# Patient Record
Sex: Male | Born: 1955 | Race: White | Hispanic: No | Marital: Married | Smoking: Never smoker
Health system: Southern US, Community
[De-identification: ages and names within clinical notes are randomized; demographics above are authoritative.]

## PROBLEM LIST (undated history)

## (undated) DIAGNOSIS — R001 Bradycardia, unspecified: Secondary | ICD-10-CM

## (undated) DIAGNOSIS — R0600 Dyspnea, unspecified: Secondary | ICD-10-CM

## (undated) DIAGNOSIS — E785 Hyperlipidemia, unspecified: Secondary | ICD-10-CM

## (undated) DIAGNOSIS — M109 Gout, unspecified: Secondary | ICD-10-CM

## (undated) DIAGNOSIS — Z8719 Personal history of other diseases of the digestive system: Secondary | ICD-10-CM

## (undated) DIAGNOSIS — R011 Cardiac murmur, unspecified: Secondary | ICD-10-CM

## (undated) DIAGNOSIS — I10 Essential (primary) hypertension: Secondary | ICD-10-CM

## (undated) DIAGNOSIS — G510 Bell's palsy: Secondary | ICD-10-CM

## (undated) HISTORY — PX: TONSILLECTOMY: SUR1361

## (undated) HISTORY — DX: Bell's palsy: G51.0

## (undated) HISTORY — DX: Essential (primary) hypertension: I10

## (undated) HISTORY — DX: Gout, unspecified: M10.9

## (undated) HISTORY — PX: COLONOSCOPY: SHX174

## (undated) HISTORY — DX: Hyperlipidemia, unspecified: E78.5

---

## 2002-04-27 ENCOUNTER — Encounter: Payer: Self-pay | Admitting: Cardiology

## 2002-04-27 ENCOUNTER — Ambulatory Visit (HOSPITAL_COMMUNITY): Admission: RE | Admit: 2002-04-27 | Discharge: 2002-04-27 | Payer: Self-pay | Admitting: Cardiology

## 2006-04-04 ENCOUNTER — Encounter (INDEPENDENT_AMBULATORY_CARE_PROVIDER_SITE_OTHER): Payer: Self-pay | Admitting: *Deleted

## 2007-09-04 ENCOUNTER — Emergency Department (HOSPITAL_COMMUNITY): Admission: EM | Admit: 2007-09-04 | Discharge: 2007-09-05 | Payer: Self-pay | Admitting: Emergency Medicine

## 2012-09-14 ENCOUNTER — Ambulatory Visit (INDEPENDENT_AMBULATORY_CARE_PROVIDER_SITE_OTHER): Payer: PRIVATE HEALTH INSURANCE | Admitting: Family Medicine

## 2012-09-14 ENCOUNTER — Encounter: Payer: Self-pay | Admitting: Family Medicine

## 2012-09-14 VITALS — BP 140/76 | HR 53 | Temp 97.9°F | Resp 16 | Ht 68.0 in | Wt 198.0 lb

## 2012-09-14 DIAGNOSIS — Z0000 Encounter for general adult medical examination without abnormal findings: Secondary | ICD-10-CM

## 2012-09-14 DIAGNOSIS — G510 Bell's palsy: Secondary | ICD-10-CM

## 2012-09-14 LAB — CBC WITH DIFFERENTIAL/PLATELET
Basophils Absolute: 0.1 10*3/uL (ref 0.0–0.1)
Basophils Relative: 1 % (ref 0–1)
Eosinophils Absolute: 0.2 10*3/uL (ref 0.0–0.7)
Eosinophils Relative: 2 % (ref 0–5)
HCT: 44.9 % (ref 39.0–52.0)
Hemoglobin: 15.4 g/dL (ref 13.0–17.0)
Lymphocytes Relative: 28 % (ref 12–46)
Lymphs Abs: 2.2 10*3/uL (ref 0.7–4.0)
MCH: 30.1 pg (ref 26.0–34.0)
MCHC: 34.3 g/dL (ref 30.0–36.0)
MCV: 87.9 fL (ref 78.0–100.0)
Monocytes Absolute: 0.7 10*3/uL (ref 0.1–1.0)
Monocytes Relative: 9 % (ref 3–12)
Neutro Abs: 4.8 10*3/uL (ref 1.7–7.7)
Neutrophils Relative %: 60 % (ref 43–77)
Platelets: 221 10*3/uL (ref 150–400)
RBC: 5.11 MIL/uL (ref 4.22–5.81)
RDW: 13.8 % (ref 11.5–15.5)
WBC: 7.9 10*3/uL (ref 4.0–10.5)

## 2012-09-14 LAB — COMPREHENSIVE METABOLIC PANEL
ALT: 18 U/L (ref 0–53)
AST: 21 U/L (ref 0–37)
Albumin: 4.8 g/dL (ref 3.5–5.2)
Alkaline Phosphatase: 58 U/L (ref 39–117)
BUN: 22 mg/dL (ref 6–23)
CO2: 27 mEq/L (ref 19–32)
Calcium: 9.7 mg/dL (ref 8.4–10.5)
Chloride: 103 mEq/L (ref 96–112)
Creat: 1.12 mg/dL (ref 0.50–1.35)
Glucose, Bld: 78 mg/dL (ref 70–99)
Potassium: 4 mEq/L (ref 3.5–5.3)
Sodium: 139 mEq/L (ref 135–145)
Total Bilirubin: 1.2 mg/dL (ref 0.3–1.2)
Total Protein: 6.5 g/dL (ref 6.0–8.3)

## 2012-09-14 LAB — LIPID PANEL
Cholesterol: 195 mg/dL (ref 0–200)
HDL: 56 mg/dL (ref >39)
LDL Cholesterol: 101 mg/dL — ABNORMAL HIGH (ref 0–99)
Total CHOL/HDL Ratio: 3.5 Ratio
Triglycerides: 189 mg/dL — ABNORMAL HIGH (ref <150)
VLDL: 38 mg/dL (ref 0–40)

## 2012-09-14 LAB — POCT UA - MICROSCOPIC ONLY
Bacteria, U Microscopic: NEGATIVE
Casts, Ur, LPF, POC: NEGATIVE
Crystals, Ur, HPF, POC: NEGATIVE
Mucus, UA: NEGATIVE
WBC, Ur, HPF, POC: NEGATIVE
Yeast, UA: NEGATIVE

## 2012-09-14 LAB — POCT GLYCOSYLATED HEMOGLOBIN (HGB A1C): Hemoglobin A1C: 4.8

## 2012-09-14 LAB — POCT URINALYSIS DIPSTICK
Bilirubin, UA: NEGATIVE
Glucose, UA: NEGATIVE
Ketones, UA: NEGATIVE
Leukocytes, UA: NEGATIVE
Nitrite, UA: NEGATIVE
Protein, UA: NEGATIVE
Spec Grav, UA: 1.025
Urobilinogen, UA: 0.2
pH, UA: 5

## 2012-09-14 LAB — IFOBT (OCCULT BLOOD): IFOBT: NEGATIVE

## 2012-09-14 NOTE — Progress Notes (Signed)
Patient ID: Dean Weaver MRN: [id number], DOB: August 11, 1955 57 y.o. Date of Encounter: 09/14/2012, 1:30 PM  Primary Physician: No primary provider on file.  Chief Complaint: Physical (CPE)  HPI: 57 y.o. y/o male with history noted below here for CPE.  Doing well. No issues/complaints.  Had Bell's palsy 5 years ago with complete neurological workup negative otherwise. Married, 2 grown boys Now exercising regularly on pilates and spin class.  He has lost 10 pounds over the summer. Colonoscopy normal 2010 Last cholesterol 2008 (total cholesterol 220)  Review of Systems: Consitutional: No fever, chills, fatigue, night sweats, lymphadenopathy, or weight changes. Eyes: No visual changes, eye redness, or discharge. ENT/Mouth: Ears: No otalgia, tinnitus, hearing loss, discharge. Nose: No congestion, rhinorrhea, sinus pain, or epistaxis. Throat: No sore throat, post nasal drip, or teeth pain. Cardiovascular: No CP, palpitations, diaphoresis, DOE, edema, orthopnea, PND. Respiratory: No cough, hemoptysis, SOB, or wheezing. Gastrointestinal: No anorexia, dysphagia, reflux, pain, nausea, vomiting, hematemesis, diarrhea, constipation, BRBPR, or melena. Genitourinary: No dysuria, frequency, urgency, hematuria, incontinence, nocturia, decreased urinary stream, discharge, impotence, or testicular pain/masses. Musculoskeletal: No decreased ROM, myalgias, stiffness, joint swelling, or weakness. Skin: No rash, erythema, lesion changes, pain, warmth, jaundice, or pruritis. Neurological: No headache, dizziness, syncope, seizures, tremors, memory loss, coordination problems, or paresthesias.  H/O Bell's palsy Psychological: No anxiety, depression, hallucinations, SI/HI. Endocrine: No fatigue, polydipsia, polyphagia, polyuria, or known diabetes. All other systems were reviewed and are otherwise negative.  No past medical history on file.   No past surgical history on file.  Home Meds:  Prior to  Admission medications   Not on File    Allergies: Not on File  History   Social History  . Marital Status: Married    Spouse Name: N/A    Number of Children: N/A  . Years of Education: N/A   Occupational History  . Not on file.   Social History Main Topics  . Smoking status: Never Smoker   . Smokeless tobacco: Not on file  . Alcohol Use: Not on file  . Drug Use: Not on file  . Sexual Activity: Not on file   Other Topics Concern  . Not on file   Social History Narrative  . No narrative on file    No family history on file.  Physical Exam:  Blood pressure recheck 118/78 Blood pressure 140/76, pulse 53, temperature 97.9 F (36.6 C), temperature source Oral, resp. rate 16, height 5\' 8"  (1.727 m), weight 198 lb (89.812 kg), SpO2 98.00%.  General: Well developed, well nourished, in no acute distress. HEENT: Normocephalic, atraumatic. Conjunctiva pink, sclera non-icteric. Pupils 2 mm constricting to 1 mm, round, regular, and equally reactive to light and accomodation. EOMI. Internal auditory canal clear. TMs with good cone of light and without pathology. Nasal mucosa pink. Nares are without discharge. No sinus tenderness. Oral mucosa pink. Dentition good. Pharynx without exudate.   Neck: Supple. Trachea midline. No thyromegaly. Full ROM. No lymphadenopathy. Lungs: Clear to auscultation bilaterally without wheezes, rales, or rhonchi. Breathing is of normal effort and unlabored. Cardiovascular: RRR with S1 S2. No murmurs, rubs, or gallops appreciated. Distal pulses 2+ symmetrically. No carotid or abdominal bruits Abdomen: Soft, non-tender, non-distended with normoactive bowel sounds. No hepatosplenomegaly or masses. No rebound/guarding. No CVA tenderness. Without hernias.  Rectal: No external hemorrhoids or fissures. Rectal vault without masses.  Genitourinary:  circumcised male. No penile lesions. Testes descended bilaterally, and smooth without tenderness or masses.    Musculoskeletal: Full range of motion and  5/5 strength throughout. Without swelling, atrophy, tenderness, crepitus, or warmth. Extremities without clubbing, cyanosis, or edema. Calves supple. Skin: Warm and moist without erythema, ecchymosis, wounds, or rash. Neuro: A+Ox3. CN II-XII grossly intact with exception of mild left facial weakness. Moves all extremities spontaneously. Full sensation throughout. Normal gait. DTR 2+ throughout upper and lower extremities. Psych:  Responds to questions appropriately with a normal affect.   Studies: CBC, CMET, Lipid, PSA, UA: pending  Assessment/Plan:  57 y.o. y/o  male here for CPE Routine general medical examination at a health care facility - Plan: Lipid panel, CBC with Differential, Comprehensive metabolic panel, PSA, POCT urinalysis dipstick, POCT UA - Microscopic Only, POCT glycosylated hemoglobin (Hb A1C), IFOBT POC (occult bld, rslt in office)    Signed, Elvina Sidle, MD 09/14/2012 1:30 PM

## 2012-09-14 NOTE — Patient Instructions (Addendum)
Patient ID: LAJARVIS ITALIANO MRN: [id number], DOB: [birth year] 57 y.o. Date of Encounter: 09/14/2012, 1:30 PM  Primary Physician: No primary provider on file.  Chief Complaint: Physical (CPE)  HPI: 57 y.o. y/o male with history noted below here for CPE.  Doing well. No issues/complaints.  Had Bell's palsy 5 years ago with complete neurological workup negative otherwise. Married, 2 boys Exercising on pilates and spin class regularly   Review of Systems: Consitutional: No fever, chills, fatigue, night sweats, lymphadenopathy, or weight changes. Eyes: No visual changes, eye redness, or discharge. ENT/Mouth: Ears: No otalgia, tinnitus, hearing loss, discharge. Nose: No congestion, rhinorrhea, sinus pain, or epistaxis. Throat: No sore throat, post nasal drip, or teeth pain. Cardiovascular: No CP, palpitations, diaphoresis, DOE, edema, orthopnea, PND. Respiratory: No cough, hemoptysis, SOB, or wheezing. Gastrointestinal: No anorexia, dysphagia, reflux, pain, nausea, vomiting, hematemesis, diarrhea, constipation, BRBPR, or melena. Genitourinary: No dysuria, frequency, urgency, hematuria, incontinence, nocturia, decreased urinary stream, discharge, impotence, or testicular pain/masses. Musculoskeletal: No decreased ROM, myalgias, stiffness, joint swelling, or weakness. Skin: No rash, erythema, lesion changes, pain, warmth, jaundice, or pruritis. Neurological: No headache, dizziness, syncope, seizures, tremors, memory loss, coordination problems, or paresthesias.  H/O Bell's palsy Psychological: No anxiety, depression, hallucinations, SI/HI. Endocrine: No fatigue, polydipsia, polyphagia, polyuria, or known diabetes. All other systems were reviewed and are otherwise negative.  No past medical history on file.   No past surgical history on file.  Home Meds:  Prior to Admission medications   Not on File    Allergies: Not on File  History   Social History  . Marital Status: Married   Spouse Name: N/A    Number of Children: N/A  . Years of Education: N/A   Occupational History  . Not on file.   Social History Main Topics  . Smoking status: Never Smoker   . Smokeless tobacco: Not on file  . Alcohol Use: Not on file  . Drug Use: Not on file  . Sexual Activity: Not on file   Other Topics Concern  . Not on file   Social History Narrative  . No narrative on file    No family history on file.  Physical Exam:  Blood pressure recheck 118/78 Blood pressure 140/76, pulse 53, temperature 97.9 F (36.6 C), temperature source Oral, resp. rate 16, height 5\' 8"  (1.727 m), weight 198 lb (89.812 kg), SpO2 98.00%.  General: Well developed, well nourished, in no acute distress. HEENT: Normocephalic, atraumatic. Conjunctiva pink, sclera non-icteric. Pupils 2 mm constricting to 1 mm, round, regular, and equally reactive to light and accomodation. EOMI. Internal auditory canal clear. TMs with good cone of light and without pathology. Nasal mucosa pink. Nares are without discharge. No sinus tenderness. Oral mucosa pink. Dentition good. Pharynx without exudate.   Neck: Supple. Trachea midline. No thyromegaly. Full ROM. No lymphadenopathy. Lungs: Clear to auscultation bilaterally without wheezes, rales, or rhonchi. Breathing is of normal effort and unlabored. Cardiovascular: RRR with S1 S2. No murmurs, rubs, or gallops appreciated. Distal pulses 2+ symmetrically. No carotid or abdominal bruits Abdomen: Soft, non-tender, non-distended with normoactive bowel sounds. No hepatosplenomegaly or masses. No rebound/guarding. No CVA tenderness. Without hernias.  Rectal: No external hemorrhoids or fissures. Rectal vault without masses.  Genitourinary:  circumcised male. No penile lesions. Testes descended bilaterally, and smooth without tenderness or masses.  Musculoskeletal: Full range of motion and 5/5 strength throughout. Without swelling, atrophy, tenderness, crepitus, or warmth. Extremities  without clubbing, cyanosis, or edema. Calves supple. Skin: Warm and moist  without erythema, ecchymosis, wounds, or rash. Neuro: A+Ox3. CN II-XII grossly intact. Moves all extremities spontaneously. Full sensation throughout. Normal gait. DTR 2+ throughout upper and lower extremities. Finger to nose intact. Psych:  Responds to questions appropriately with a normal affect.   Studies: CBC, CMET, Lipid, PSA, UA: pending  Assessment/Plan:  57 y.o. y/o  male here for CPE.  He has lost 10 pounds and is now much more fit than he has been in years.  Expect labs will be normal.  Signed, Elvina Sidle, MD 09/14/2012 1:30 PM

## 2012-09-15 LAB — PSA: PSA: 1.14 ng/mL (ref <=4.00)

## 2012-10-21 DIAGNOSIS — Z0271 Encounter for disability determination: Secondary | ICD-10-CM

## 2013-05-14 ENCOUNTER — Other Ambulatory Visit: Payer: Self-pay | Admitting: Family Medicine

## 2013-05-14 DIAGNOSIS — M109 Gout, unspecified: Secondary | ICD-10-CM

## 2013-05-14 MED ORDER — COLCHICINE 0.6 MG PO TABS: 0.6000 mg | ORAL_TABLET | Freq: Every day | ORAL | Status: DC

## 2013-10-11 ENCOUNTER — Encounter: Payer: PRIVATE HEALTH INSURANCE | Admitting: Family Medicine

## 2013-11-01 ENCOUNTER — Ambulatory Visit (INDEPENDENT_AMBULATORY_CARE_PROVIDER_SITE_OTHER): Payer: PRIVATE HEALTH INSURANCE | Admitting: Family Medicine

## 2013-11-01 ENCOUNTER — Encounter: Payer: Self-pay | Admitting: Family Medicine

## 2013-11-01 VITALS — BP 138/90 | HR 47 | Temp 97.7°F | Resp 16 | Ht 68.5 in | Wt 196.2 lb

## 2013-11-01 DIAGNOSIS — G510 Bell's palsy: Secondary | ICD-10-CM

## 2013-11-01 DIAGNOSIS — R030 Elevated blood-pressure reading, without diagnosis of hypertension: Secondary | ICD-10-CM

## 2013-11-01 DIAGNOSIS — Z0000 Encounter for general adult medical examination without abnormal findings: Secondary | ICD-10-CM

## 2013-11-01 DIAGNOSIS — M1000 Idiopathic gout, unspecified site: Secondary | ICD-10-CM

## 2013-11-01 DIAGNOSIS — Z1211 Encounter for screening for malignant neoplasm of colon: Secondary | ICD-10-CM

## 2013-11-01 DIAGNOSIS — M109 Gout, unspecified: Secondary | ICD-10-CM

## 2013-11-01 DIAGNOSIS — IMO0001 Reserved for inherently not codable concepts without codable children: Secondary | ICD-10-CM

## 2013-11-01 LAB — COMPLETE METABOLIC PANEL WITH GFR
ALT: 18 U/L (ref 0–53)
AST: 18 U/L (ref 0–37)
Albumin: 4.7 g/dL (ref 3.5–5.2)
Alkaline Phosphatase: 62 U/L (ref 39–117)
BUN: 23 mg/dL (ref 6–23)
CO2: 28 mEq/L (ref 19–32)
Calcium: 9.6 mg/dL (ref 8.4–10.5)
Chloride: 105 mEq/L (ref 96–112)
Creat: 1.31 mg/dL (ref 0.50–1.35)
GFR, Est African American: 68 mL/min
GFR, Est Non African American: 59 mL/min — ABNORMAL LOW
Glucose, Bld: 86 mg/dL (ref 70–99)
Potassium: 4.4 mEq/L (ref 3.5–5.3)
Sodium: 140 mEq/L (ref 135–145)
Total Bilirubin: 0.8 mg/dL (ref 0.2–1.2)
Total Protein: 6.8 g/dL (ref 6.0–8.3)

## 2013-11-01 LAB — CBC WITH DIFFERENTIAL/PLATELET
Basophils Absolute: 0.1 10*3/uL (ref 0.0–0.1)
Basophils Relative: 1 % (ref 0–1)
Eosinophils Absolute: 0.1 10*3/uL (ref 0.0–0.7)
Eosinophils Relative: 2 % (ref 0–5)
HCT: 45.1 % (ref 39.0–52.0)
Hemoglobin: 15.1 g/dL (ref 13.0–17.0)
Lymphocytes Relative: 23 % (ref 12–46)
Lymphs Abs: 1.6 10*3/uL (ref 0.7–4.0)
MCH: 29.5 pg (ref 26.0–34.0)
MCHC: 33.5 g/dL (ref 30.0–36.0)
MCV: 88.1 fL (ref 78.0–100.0)
Monocytes Absolute: 0.6 10*3/uL (ref 0.1–1.0)
Monocytes Relative: 8 % (ref 3–12)
Neutro Abs: 4.6 10*3/uL (ref 1.7–7.7)
Neutrophils Relative %: 66 % (ref 43–77)
Platelets: 246 10*3/uL (ref 150–400)
RBC: 5.12 MIL/uL (ref 4.22–5.81)
RDW: 13.5 % (ref 11.5–15.5)
WBC: 6.9 10*3/uL (ref 4.0–10.5)

## 2013-11-01 LAB — POCT URINALYSIS DIPSTICK
Bilirubin, UA: NEGATIVE
Glucose, UA: NEGATIVE
Ketones, UA: NEGATIVE
Leukocytes, UA: NEGATIVE
Nitrite, UA: NEGATIVE
Protein, UA: NEGATIVE
Spec Grav, UA: 1.01
Urobilinogen, UA: 0.2
pH, UA: 5

## 2013-11-01 LAB — URIC ACID: Uric Acid, Serum: 7.8 mg/dL (ref 4.0–7.8)

## 2013-11-01 LAB — LIPID PANEL
Cholesterol: 199 mg/dL (ref 0–200)
HDL: 54 mg/dL (ref >39)
LDL Cholesterol: 115 mg/dL — ABNORMAL HIGH (ref 0–99)
Total CHOL/HDL Ratio: 3.7 Ratio
Triglycerides: 150 mg/dL — ABNORMAL HIGH (ref <150)
VLDL: 30 mg/dL (ref 0–40)

## 2013-11-01 LAB — IFOBT (OCCULT BLOOD): IFOBT: NEGATIVE

## 2013-11-01 LAB — SEDIMENTATION RATE: Sed Rate: 4 mm/hr (ref 0–16)

## 2013-11-01 MED ORDER — FLUOXETINE HCL 20 MG PO TABS: 20.0000 mg | ORAL_TABLET | Freq: Every day | ORAL | Status: DC

## 2013-11-01 MED ORDER — LISINOPRIL 10 MG PO TABS: 10.0000 mg | ORAL_TABLET | Freq: Every day | ORAL | Status: DC

## 2013-11-01 NOTE — Patient Instructions (Signed)

## 2013-11-01 NOTE — Progress Notes (Signed)
Subjective:    Patient ID: Dean Weaver, male    DOB: 04-Sep-1955, 58 y.o.   MRN: [id number]  HPI    Review of Systems  Constitutional: Negative.   HENT: Negative.   Eyes: Positive for discharge.  Respiratory: Negative.   Cardiovascular: Negative.   Gastrointestinal: Negative.   Endocrine: Negative.   Genitourinary: Negative.   Musculoskeletal: Positive for arthralgias, myalgias and neck stiffness.  Skin: Negative.   Allergic/Immunologic: Negative.   Neurological: Negative.   Hematological: Negative.   Psychiatric/Behavioral: Negative.        Objective:   Physical Exam        Assessment & Plan:     Patient ID: Dean Weaver MRN: [id number], DOB: 04-Sep-1955 58 y.o. Date of Encounter: 11/01/2013, 10:54 AM  Primary Physician: No primary provider on file.  Chief Complaint: Physical (CPE)  HPI: 58 y.o. y/o male with history noted below here for CPE.  Doing well. No issues/complaints.  Review of Systems: Consitutional: No fever, chills, fatigue, night sweats, lymphadenopathy, or weight changes. Eyes: No visual changes, eye redness, or discharge. ENT/Mouth: Ears: No otalgia, tinnitus, hearing loss, discharge. Nose: No congestion, rhinorrhea, sinus pain, or epistaxis. Throat: No sore throat, post nasal drip, or teeth pain. Cardiovascular: No CP, palpitations, diaphoresis, DOE, edema, orthopnea, PND. Respiratory: No cough, hemoptysis, SOB, or wheezing. Gastrointestinal: No anorexia, dysphagia, reflux, pain, nausea, vomiting, hematemesis, diarrhea, constipation, BRBPR, or melena. Genitourinary: No dysuria, frequency, urgency, hematuria, incontinence, nocturia, decreased urinary stream, discharge, impotence, or testicular pain/masses. Musculoskeletal: No decreased ROM, myalgias, stiffness, joint swelling, or weakness. Skin: No rash, erythema, lesion changes, pain, warmth, jaundice, or pruritis. Neurological: No headache, dizziness, syncope, seizures, tremors,  memory loss, coordination problems, or paresthesias. Psychological: No anxiety, depression, hallucinations, SI/HI. Endocrine: No fatigue, polydipsia, polyphagia, polyuria, or known diabetes. All other systems were reviewed and are otherwise negative.  Past Medical History  Diagnosis Date  . Bell's palsy      No past surgical history on file.  Home Meds:  Prior to Admission medications   Medication Sig Start Date End Date Taking? Authorizing Provider  colchicine 0.6 MG tablet Take 1 tablet (0.6 mg total) by mouth daily. 05/14/13   Elvina SidleKurt Lauenstein, MD  FLUoxetine (PROZAC) 20 MG tablet Take 1 tablet (20 mg total) by mouth daily. 11/01/13   Elvina SidleKurt Lauenstein, MD  lisinopril (PRINIVIL,ZESTRIL) 10 MG tablet Take 1 tablet (10 mg total) by mouth daily. 11/01/13   Elvina SidleKurt Lauenstein, MD    Allergies: No Known Allergies  History   Social History  . Marital Status: Married    Spouse Name: N/A    Number of Children: N/A  . Years of Education: N/A   Occupational History  . Not on file.   Social History Main Topics  . Smoking status: Never Smoker   . Smokeless tobacco: Not on file  . Alcohol Use: Not on file  . Drug Use: Not on file  . Sexual Activity: Not on file   Other Topics Concern  . Not on file   Social History Narrative  . No narrative on file    Family History  Problem Relation Age of Onset  . Cancer Mother   . Hyperlipidemia Father   . Hypertension Father   . Hyperlipidemia Sister   . Heart disease Maternal Grandfather   . Heart disease Paternal Grandfather     Physical Exam: Blood pressure 138/90, pulse 47, temperature 97.7 F (36.5 C), temperature source Oral, resp. rate 16, height 5' 8.5" (1.74  m), weight 196 lb 3.2 oz (88.996 kg), SpO2 98.00%.  BP Readings from Last 3 Encounters:  11/01/13 138/90  09/14/12 140/76   General: Well developed, well nourished, in no acute distress. HEENT: Normocephalic, atraumatic. Conjunctiva pink, sclera non-icteric. Pupils 2 mm  constricting to 1 mm, round, regular, and equally reactive to light and accomodation. EOMI. Internal auditory canal clear. TMs with good cone of light and without pathology. Nasal mucosa pink. Nares are without discharge. No sinus tenderness. Oral mucosa pink. Dentition good. Pharynx without exudate. Ptosis of left lid.  Neck: Supple. Trachea midline. No thyromegaly. Full ROM. No lymphadenopathy. Lungs: Clear to auscultation bilaterally without wheezes, rales, or rhonchi. Breathing is of normal effort and unlabored. Cardiovascular: RRR with S1 S2. No murmurs, rubs, or gallops appreciated. Distal pulses 2+ symmetrically. No carotid or abdominal bruits Abdomen: Soft, non-tender, non-distended with normoactive bowel sounds. No hepatosplenomegaly or masses. No rebound/guarding. No CVA tenderness. Without hernias.  Rectal: No external hemorrhoids or fissures. Rectal vault without masses.  Genitourinary:  circumcised male. No penile lesions. Testes descended bilaterally, and smooth without tenderness or masses.  Musculoskeletal: Full range of motion and 5/5 strength throughout. Without swelling, atrophy, tenderness, crepitus, or warmth. Extremities without clubbing, cyanosis, or edema. Calves supple. Skin: Warm and moist without erythema, ecchymosis, wounds, or rash. Neuro: A+Ox3. CN II-XII grossly intact. Moves all extremities spontaneously. Full sensation throughout. Normal gait. DTR 2+ throughout upper and lower extremities. Finger to nose intact. Psych:  Responds to questions appropriately with a normal affect.   Lab Results  Component Value Date   CHOL 195 09/14/2012   Lab Results  Component Value Date   HDL 56 09/14/2012   Lab Results  Component Value Date   LDLCALC 101* 09/14/2012   Lab Results  Component Value Date   TRIG 189* 09/14/2012   Lab Results  Component Value Date   CHOLHDL 3.5 09/14/2012   No results found for this basename: LDLDIRECT    Assessment/Plan:  58 y.o. y/o generally  healthy male here for CPE with recent financial stress   Routine general medical examination at a health care facility - Plan: POCT urinalysis dipstick, Ambulatory referral to Gastroenterology  Bell's palsy - Plan: POCT urinalysis dipstick, Lipid panel, CBC with Differential, PSA, COMPLETE METABOLIC PANEL WITH GFR, Lyme disease dna by pcr(borrelia burg)  Elevated BP - Plan: lisinopril (PRINIVIL,ZESTRIL) 10 MG tablet, Lipid panel, CBC with Differential, PSA, COMPLETE METABOLIC PANEL WITH GFR, Lyme disease dna by pcr(borrelia burg)  Gout of big toe - Plan: Sedimentation rate, Lipid panel, CBC with Differential, PSA, COMPLETE METABOLIC PANEL WITH GFR, Uric acid, Lyme disease dna by pcr(borrelia burg)  Screening for colon cancer - Plan: IFOBT POC (occult bld, rslt in office)  Signed, Elvina SidleKurt Lauenstein, MD

## 2013-11-02 LAB — PSA: PSA: 0.79 ng/mL (ref <=4.00)

## 2013-11-03 LAB — LYME DISEASE DNA BY PCR(BORRELIA BURG): B burgdorferi DNA: NOT DETECTED

## 2014-09-29 ENCOUNTER — Ambulatory Visit (INDEPENDENT_AMBULATORY_CARE_PROVIDER_SITE_OTHER): Payer: PRIVATE HEALTH INSURANCE | Admitting: Family Medicine

## 2014-09-29 ENCOUNTER — Encounter: Payer: Self-pay | Admitting: Family Medicine

## 2014-09-29 VITALS — BP 112/76 | HR 50 | Temp 97.9°F | Resp 16 | Ht 68.0 in | Wt 196.0 lb

## 2014-09-29 DIAGNOSIS — R21 Rash and other nonspecific skin eruption: Secondary | ICD-10-CM | POA: Diagnosis not present

## 2014-09-29 DIAGNOSIS — M25579 Pain in unspecified ankle and joints of unspecified foot: Secondary | ICD-10-CM

## 2014-09-29 DIAGNOSIS — G933 Postviral fatigue syndrome: Secondary | ICD-10-CM

## 2014-09-29 DIAGNOSIS — G9331 Postviral fatigue syndrome: Secondary | ICD-10-CM

## 2014-09-29 DIAGNOSIS — Z0000 Encounter for general adult medical examination without abnormal findings: Secondary | ICD-10-CM

## 2014-09-29 LAB — LIPID PANEL
Cholesterol: 193 mg/dL (ref 125–200)
HDL: 48 mg/dL (ref >=40)
LDL Cholesterol: 113 mg/dL (ref <130)
Total CHOL/HDL Ratio: 4 Ratio (ref <=5.0)
Triglycerides: 158 mg/dL — ABNORMAL HIGH (ref <150)
VLDL: 32 mg/dL — ABNORMAL HIGH (ref <30)

## 2014-09-29 LAB — POCT CBC
Granulocyte percent: 65.5 %G (ref 37–80)
HCT, POC: 43.2 % — AB (ref 43.5–53.7)
Hemoglobin: 14.3 g/dL (ref 14.1–18.1)
Lymph, poc: 1.9 (ref 0.6–3.4)
MCH, POC: 28.7 pg (ref 27–31.2)
MCHC: 33.1 g/dL (ref 31.8–35.4)
MCV: 86.6 fL (ref 80–97)
MID (cbc): 0.5 (ref 0–0.9)
MPV: 8.7 fL (ref 0–99.8)
POC Granulocyte: 4.6 (ref 2–6.9)
POC LYMPH PERCENT: 27.5 %L (ref 10–50)
POC MID %: 7 %M (ref 0–12)
Platelet Count, POC: 240 10*3/uL (ref 142–424)
RBC: 4.99 M/uL (ref 4.69–6.13)
RDW, POC: 12.8 %
WBC: 7 10*3/uL (ref 4.6–10.2)

## 2014-09-29 LAB — POCT URINALYSIS DIP (MANUAL ENTRY)
Bilirubin, UA: NEGATIVE
Blood, UA: NEGATIVE
Glucose, UA: NEGATIVE
Ketones, POC UA: NEGATIVE
Leukocytes, UA: NEGATIVE
Nitrite, UA: NEGATIVE
Protein Ur, POC: NEGATIVE
Spec Grav, UA: 1.02
Urobilinogen, UA: 0.2
pH, UA: 5

## 2014-09-29 LAB — COMPLETE METABOLIC PANEL WITH GFR
ALT: 24 U/L (ref 9–46)
AST: 23 U/L (ref 10–35)
Albumin: 4.7 g/dL (ref 3.6–5.1)
Alkaline Phosphatase: 59 U/L (ref 40–115)
BUN: 22 mg/dL (ref 7–25)
CO2: 26 mmol/L (ref 20–31)
Calcium: 9.7 mg/dL (ref 8.6–10.3)
Chloride: 106 mmol/L (ref 98–110)
Creat: 1.26 mg/dL (ref 0.70–1.33)
GFR, Est African American: 72 mL/min (ref >=60)
GFR, Est Non African American: 62 mL/min (ref >=60)
Glucose, Bld: 89 mg/dL (ref 65–99)
Potassium: 4.5 mmol/L (ref 3.5–5.3)
Sodium: 142 mmol/L (ref 135–146)
Total Bilirubin: 0.8 mg/dL (ref 0.2–1.2)
Total Protein: 6.6 g/dL (ref 6.1–8.1)

## 2014-09-29 LAB — TSH: TSH: 1.457 u[IU]/mL (ref 0.350–4.500)

## 2014-09-29 LAB — POCT SEDIMENTATION RATE: POCT SED RATE: 14 mm/hr (ref 0–22)

## 2014-09-29 LAB — HEPATITIS C ANTIBODY: HCV Ab: NEGATIVE

## 2014-09-29 LAB — VITAMIN B12: Vitamin B-12: 417 pg/mL (ref 211–911)

## 2014-09-29 LAB — VITAMIN D 25 HYDROXY (VIT D DEFICIENCY, FRACTURES): Vit D, 25-Hydroxy: 26 ng/mL — ABNORMAL LOW (ref 30–100)

## 2014-09-29 LAB — URIC ACID: Uric Acid, Serum: 8.1 mg/dL — ABNORMAL HIGH (ref 4.0–7.8)

## 2014-09-29 MED ORDER — KETOCONAZOLE 2 % EX CREA: 1.0000 "application " | TOPICAL_CREAM | Freq: Every day | CUTANEOUS | Status: DC

## 2014-09-29 NOTE — Patient Instructions (Signed)

## 2014-09-29 NOTE — Progress Notes (Addendum)
Patient ID: Dean Weaver MRN: [id number], DOB: 02/24/1955 59 y.o. Date of Encounter: 09/29/2014, 8:20 AM  Primary Physician: No primary care provider on file.  Chief Complaint: Foot pain  HPI: 59 y.o. y/o male with history noted below here for multiple problems.  Doing well. 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 He notes a bump in the back of his scalp along with a red rash which is appeared over the last year or 2.  He's had foot pain intermittently for 20 years. It's been thought to be gout and he seen a number people, taken Indocin and prednisone which have helped, but the pain keeps coming back. Most recently the pain is been on the bottom of his foot.  Patient had Bell's palsy for 6-7 years and he has intermittent neck spasm and some fasciculations. Bell's palsy is never completely resolved.  Patient also has some left elbow soreness in the medial olecranon groove which comes and goes.  Patient's very active and that he walks every day 3-4 miles, goes to the gym and lifts, and currently is working on a diet to lose some weight.  He is requesting a colonoscopy  Patient takes Crestor 10 mg daily, lisinopril 10 mg daily, and fluoxetine 20 mg when necessary.  Review of Systems: Consitutional: No fever, chills, night sweats, lymphadenopathy, or weight changes. Eyes: No visual changes, eye redness, or discharge. ENT/Mouth: Ears: No otalgia, tinnitus, hearing loss, discharge. Nose: No congestion, rhinorrhea, sinus pain, or epistaxis. Throat: No sore throat, post nasal drip, or teeth pain. Cardiovascular: No CP, palpitations, diaphoresis, DOE, edema, orthopnea, PND. Respiratory: No cough, hemoptysis, SOB, or wheezing. Gastrointestinal: No anorexia, dysphagia, reflux, pain, nausea, vomiting, hematemesis, diarrhea, constipation, BRBPR, or melena. Genitourinary: No dysuria,  frequency, urgency, hematuria, incontinence, nocturia, decreased urinary stream, discharge, impotence, or testicular pain/masses. Musculoskeletal: No decreased ROM, myalgias, stiffness, joint swelling, or weakness. Skin: No pain, warmth, jaundice, or pruritis. Neurological: No headache, dizziness, syncope, seizures, tremors, memory loss, coordination problems, or paresthesias. Psychological: No anxiety, depression, hallucinations, SI/HI. Endocrine: No fatigue, polydipsia, polyphagia, polyuria, or known diabetes. All other systems were reviewed and are otherwise negative.  Past Medical History  Diagnosis Date  . Bell's palsy      No past surgical history on file.  Home Meds:  Prior to Admission medications   Medication Sig Start Date End Date Taking? Authorizing Provider  colchicine 0.6 MG tablet Take 1 tablet (0.6 mg total) by mouth daily. 05/14/13   Elvina Sidle, MD  FLUoxetine (PROZAC) 20 MG tablet Take 1 tablet (20 mg total) by mouth daily. 11/01/13   Elvina Sidle, MD  lisinopril (PRINIVIL,ZESTRIL) 10 MG tablet Take 1 tablet (10 mg total) by mouth daily. 11/01/13   Elvina Sidle, MD    Allergies: No Known Allergies  Social History   Social History  . Marital Status: Married    Spouse Name: N/A  . Number of Children: N/A  . Years of Education: N/A   Occupational History  . Not on file.   Social History Main Topics  . Smoking status: Never Smoker   . Smokeless tobacco: Not on file  . Alcohol Use: Not on file  . Drug Use: Not on file  . Sexual Activity: Not on file   Other Topics Concern  . Not on file   Social History Narrative  . No narrative on file    Family  History  Problem Relation Age of Onset  . Cancer Mother   . Hyperlipidemia Father   . Hypertension Father   . Hyperlipidemia Sister   . Heart disease Maternal Grandfather   . Heart disease Paternal Grandfather     Physical Exam: Blood pressure 112/76, pulse 50, temperature 97.9 F (36.6 C),  resp. rate 16.  BP Readings from Last 3 Encounters:  09/29/14 112/76  11/01/13 138/90  09/14/12 140/76   General: Well developed, well nourished, in no acute distress. HEENT: Normocephalic, atraumatic. Conjunctiva pink, sclera non-icteric. Pupils 2 mm constricting to 1 mm, round, regular, and equally reactive to light and accomodation. EOMI.  Fundi benign   Internal auditory canal clear. TMs with good cone of light and without pathology. Nasal mucosa pink. Nares are without discharge. No sinus tenderness. Oral mucosa pink. Dentition goo. Pharynx without exudate.    Neck: Supple. Trachea midline. No thyromegaly. Full ROM. No lymphadenopathy. Lungs: Clear to auscultation bilaterally without wheezes, rales, or rhonchi. Breathing is of normal effort and unlabored. Cardiovascular: RRR with S1 S2. No murmurs, rubs, or gallops appreciated. Distal pulses 2+ symmetrically. No carotid or abdominal bruits Abdomen: Soft, non-tender, non-distended with normoactive bowel sounds. No hepatosplenomegaly or masses. No rebound/guarding. No CVA tenderness. Without hernias.  Rectal: No external hemorrhoids or fissures. Rectal vault without masses.  Genitourinary:  circumcised male. No penile lesions. Testes descended bilaterally, and smooth without tenderness or masses.  Musculoskeletal: Full range of motion and 5/5 strength throughout. Without swelling, atrophy, tenderness, crepitus, or warmth. Extremities without clubbing, cyanosis, or edema. Calves supple. Skin: Warm and moist without erythema, ecchymosis, wounds.  Multiple small freckles on his back, confluent irregular rash in the nape of the neck overlapping the hairline Neuro: A+Ox3. CN II-XII grossly intact. Moves all extremities spontaneously. Full sensation throughout. Normal gait. DTR 2+ throughout upper and lower extremities. Finger to nose intact. Psych:  Responds to questions appropriately with a normal affect.   Lab Results  Component Value Date    CHOL 199 11/01/2013   CHOL 195 09/14/2012   Lab Results  Component Value Date   HDL 54 11/01/2013   HDL 56 09/14/2012   Lab Results  Component Value Date   LDLCALC 115* 11/01/2013   LDLCALC 101* 09/14/2012   Lab Results  Component Value Date   TRIG 150* 11/01/2013   TRIG 189* 09/14/2012   Lab Results  Component Value Date   CHOLHDL 3.7 11/01/2013   CHOLHDL 3.5 09/14/2012   No results found for: LDLDIRECT   EKG shows sinus bradycardia with J-point elevation in V2 and V3  Assessment/Plan:  59 y.o. y/o otherwise healthy male here for CPE   ICD-9-CM ICD-10-CM   1. Postviral fatigue syndrome 780.71 G93.3 POCT CBC     COMPLETE METABOLIC PANEL WITH GFR     Lipid panel     POCT urinalysis dipstick     POCT SEDIMENTATION RATE     TSH     Vitamin B12     Testosterone, Free, Total, SHBG     Hepatitis C antibody     Vit D  25 hydroxy (rtn osteoporosis monitoring)    Signed, Elvina Sidle, MD 09/29/2014 8:20 AM

## 2014-09-30 LAB — TESTOSTERONE, FREE, TOTAL, SHBG
Sex Hormone Binding: 29 nmol/L (ref 22–77)
Testosterone, Free: 84.5 pg/mL (ref 47.0–244.0)
Testosterone-% Free: 2.2 % (ref 1.6–2.9)
Testosterone: 388 ng/dL (ref 300–890)

## 2014-09-30 LAB — PSA: PSA: 1.7 ng/mL (ref <=4.00)

## 2014-10-05 ENCOUNTER — Encounter: Payer: Self-pay | Admitting: Family Medicine

## 2014-12-08 ENCOUNTER — Other Ambulatory Visit: Payer: Self-pay | Admitting: Family Medicine

## 2015-01-11 ENCOUNTER — Other Ambulatory Visit: Payer: Self-pay | Admitting: Family Medicine

## 2015-03-01 ENCOUNTER — Other Ambulatory Visit: Payer: Self-pay | Admitting: Family Medicine

## 2015-03-02 ENCOUNTER — Other Ambulatory Visit: Payer: Self-pay | Admitting: Family Medicine

## 2015-03-02 MED ORDER — FLUOXETINE HCL 20 MG PO TABS: 20.0000 mg | ORAL_TABLET | Freq: Every day | ORAL | Status: DC

## 2015-03-09 ENCOUNTER — Other Ambulatory Visit: Payer: Self-pay | Admitting: Family Medicine

## 2015-03-09 DIAGNOSIS — F4323 Adjustment disorder with mixed anxiety and depressed mood: Secondary | ICD-10-CM

## 2015-03-09 MED ORDER — FLUOXETINE HCL 20 MG PO TABS: 20.0000 mg | ORAL_TABLET | Freq: Every day | ORAL | Status: DC

## 2015-04-19 ENCOUNTER — Other Ambulatory Visit: Payer: Self-pay | Admitting: Family Medicine

## 2015-04-28 ENCOUNTER — Other Ambulatory Visit: Payer: Self-pay | Admitting: Family Medicine

## 2015-05-03 ENCOUNTER — Other Ambulatory Visit: Payer: Self-pay | Admitting: Family Medicine

## 2015-05-03 MED ORDER — LISINOPRIL 10 MG PO TABS: 10.0000 mg | ORAL_TABLET | Freq: Every day | ORAL | Status: DC

## 2015-08-16 ENCOUNTER — Encounter: Payer: Self-pay | Admitting: Physician Assistant

## 2015-08-16 DIAGNOSIS — K219 Gastro-esophageal reflux disease without esophagitis: Secondary | ICD-10-CM | POA: Insufficient documentation

## 2016-01-22 ENCOUNTER — Other Ambulatory Visit: Payer: Self-pay | Admitting: Family Medicine

## 2016-02-15 ENCOUNTER — Other Ambulatory Visit: Payer: Self-pay | Admitting: Family Medicine

## 2016-03-27 ENCOUNTER — Other Ambulatory Visit: Payer: Self-pay | Admitting: Family Medicine

## 2016-04-13 ENCOUNTER — Other Ambulatory Visit: Payer: Self-pay | Admitting: Family Medicine

## 2016-04-23 ENCOUNTER — Other Ambulatory Visit: Payer: Self-pay | Admitting: Family Medicine

## 2016-05-08 ENCOUNTER — Other Ambulatory Visit: Payer: Self-pay | Admitting: Family Medicine

## 2016-05-08 NOTE — Telephone Encounter (Signed)
Refill request for lisinopril denied, as we have not seen the patient since 09/2014.  Please call this patient to schedule visit to establish with new PCP, as Dr. Milus Glazier has retired, if he would like to return here.

## 2016-05-08 NOTE — Telephone Encounter (Signed)
SPOKE WITH PT ADVISED HIM TO MAKE AN APPOINTMENT WITH ONE OF OUR NEW PROVIDERS BECAUSE WE HAVENT SEEN HIM SINCE 09-2014 HE STATES THAT HE WILL CALL HIS OLD PCP TO SEE IF THEY WILL FILL MEDICINE AND IF NOT HE WILL CALL us BACK TO SCHEDULE AN APPOINTMENT

## 2016-11-26 ENCOUNTER — Encounter (HOSPITAL_COMMUNITY): Payer: Self-pay | Admitting: Emergency Medicine

## 2016-11-26 ENCOUNTER — Ambulatory Visit (HOSPITAL_COMMUNITY)
Admission: EM | Admit: 2016-11-26 | Discharge: 2016-11-26 | Disposition: A | Discharge status: Home / Self Care | Payer: PRIVATE HEALTH INSURANCE | Attending: Family Medicine | Admitting: Family Medicine

## 2016-11-26 DIAGNOSIS — E7801 Familial hypercholesterolemia: Secondary | ICD-10-CM | POA: Diagnosis not present

## 2016-11-26 DIAGNOSIS — Z760 Encounter for issue of repeat prescription: Secondary | ICD-10-CM | POA: Diagnosis not present

## 2016-11-26 DIAGNOSIS — F4323 Adjustment disorder with mixed anxiety and depressed mood: Secondary | ICD-10-CM

## 2016-11-26 DIAGNOSIS — I10 Essential (primary) hypertension: Secondary | ICD-10-CM | POA: Diagnosis not present

## 2016-11-26 DIAGNOSIS — Z8739 Personal history of other diseases of the musculoskeletal system and connective tissue: Secondary | ICD-10-CM

## 2016-11-26 MED ORDER — ALLOPURINOL 100 MG PO TABS: 100.0000 mg | ORAL_TABLET | Freq: Every day | ORAL | 3 refills | Status: AC

## 2016-11-26 MED ORDER — FLUOXETINE HCL 20 MG PO TABS: 20.0000 mg | ORAL_TABLET | Freq: Every day | ORAL | 3 refills | Status: DC

## 2016-11-26 MED ORDER — LISINOPRIL 20 MG PO TABS: 20.0000 mg | ORAL_TABLET | Freq: Every day | ORAL | 3 refills | Status: DC

## 2016-11-26 MED ORDER — ROSUVASTATIN CALCIUM 10 MG PO TABS: 10.0000 mg | ORAL_TABLET | Freq: Every day | ORAL | 3 refills | Status: DC

## 2016-11-26 NOTE — Discharge Instructions (Signed)
Continue to monitor blood pressure

## 2016-11-26 NOTE — ED Provider Notes (Addendum)
61 yo man here for review of problems and refill of medication.  He has been running a high blood pressure of 140's/90's.  Joints are continuing to bother him from time to time.  Recent labs show cholesterol over 250 total. Exercises 3 x week, mainly strength work.  Walks daily.   Occasional alcohol Nonsmoker  Social:  Both parents have died.  Married with two sons who live in [place].  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  PMHx: Bell's palsy Meds:  Simvastatin 10  Lisinopril 10  Allopurinol 100  Fluoxetine 20 Problems:  Hyperlipidemia  Hypertension  Recurrent gout in either ankle  anxiety  Family Hx:  Father died of pneumonia.  PE:  BP (!) 162/86 (BP Location: Left Arm)   Pulse (!) 49   Temp 98.5 F (36.9 C) (Oral)   Resp 20   SpO2 99%  General appearance: healthy white male. 196 lbs. HEENT: normal fundi  Weak left facial muscles  Normal canals and TM's with small anterior nodule left canal, smooth  Normal oropharynx Neck:  1 cm subcutaneous fullness left posterior cervical area, no bruit,supple Chest:  Clear Axillae: no adenopathy Heart: regular, no murmur or gallop Abdomen: no bruit, mass, organomegaly Genitalia:  Normal, no hernia Extrem: no fem bruit or adenopathy, good periph pulses Skin:  Clear Neuro:  Left facial, otherwise intact CN III-XII, motor strength Rectal exam:  Mild BPH, no masses  Assessment:  The blood pressure has been borderline for a year.  The cholesterol recently checked is too high.  Otherwise things remain stable.  Plan:   Elevated BP- Increase lisinopril to 20 mg qd Will refer to Dr. Jomarie Longsroituru for consideration of stress test Elevated cholesterol- Change to Crestor 10 qd Patient to arrange TSH Anxiety and recurrent gout - Continue the allopurinol and fluoxetine Health maintenance:  Patient to call [facility] for follow up colonoscopy  Elvina SidleKurt Lakeasha Petion, MD   Elvina SidleLauenstein, Lamica Mccart, MD 11/26/16 1008    Elvina SidleLauenstein, Kalani Baray,  MD 11/26/16 1016

## 2016-11-26 NOTE — ED Triage Notes (Signed)
Pt here for a 61 y/o physical exam

## 2016-12-24 ENCOUNTER — Encounter: Payer: Self-pay | Admitting: *Deleted

## 2016-12-30 DIAGNOSIS — G510 Bell's palsy: Secondary | ICD-10-CM | POA: Insufficient documentation

## 2017-01-01 ENCOUNTER — Ambulatory Visit (INDEPENDENT_AMBULATORY_CARE_PROVIDER_SITE_OTHER): Payer: PRIVATE HEALTH INSURANCE | Admitting: Cardiovascular Disease

## 2017-01-01 ENCOUNTER — Encounter: Payer: Self-pay | Admitting: Cardiovascular Disease

## 2017-01-01 ENCOUNTER — Ambulatory Visit (HOSPITAL_COMMUNITY)
Admission: RE | Admit: 2017-01-01 | Discharge: 2017-01-01 | Disposition: A | Discharge status: Home / Self Care | Payer: PRIVATE HEALTH INSURANCE | Source: Ambulatory Visit | Attending: Cardiovascular Disease | Admitting: Cardiovascular Disease

## 2017-01-01 VITALS — BP 144/90 | HR 50 | Ht 68.5 in | Wt 198.4 lb

## 2017-01-01 DIAGNOSIS — E785 Hyperlipidemia, unspecified: Secondary | ICD-10-CM | POA: Diagnosis not present

## 2017-01-01 DIAGNOSIS — M109 Gout, unspecified: Secondary | ICD-10-CM | POA: Insufficient documentation

## 2017-01-01 DIAGNOSIS — I4589 Other specified conduction disorders: Secondary | ICD-10-CM

## 2017-01-01 DIAGNOSIS — R001 Bradycardia, unspecified: Secondary | ICD-10-CM | POA: Insufficient documentation

## 2017-01-01 DIAGNOSIS — I10 Essential (primary) hypertension: Secondary | ICD-10-CM | POA: Diagnosis not present

## 2017-01-01 LAB — EXERCISE TOLERANCE TEST
CHL CUP MPHR: 158 {beats}/min
CSEPED: 8 min
CSEPEDS: 2 s
CSEPHR: 99 %
CSEPPHR: 157 {beats}/min
Estimated workload: 10 METS
RPE: 18
Rest HR: 47 {beats}/min

## 2017-01-01 MED ORDER — LISINOPRIL 40 MG PO TABS: 40.0000 mg | ORAL_TABLET | Freq: Every day | ORAL | 3 refills | Status: DC

## 2017-01-01 NOTE — Progress Notes (Signed)
Cardiology Office Note:    Date:  01/01/2017   ID:  Dean Hopesaul J Rames, DOB [birth year], MRN [id number] PCP:  Elvina SidleLauenstein, Kurt, MD  Cardiologist:  Thurmon FairMihai Shoichi Mielke, MD    Referring MD: Elvina SidleLauenstein, Kurt, MD   Chief Complaint  Patient presents with  . New Patient (Initial Visit)    sob, hypertension, fatigue, light headed during cardio exercise, slow heart rate (discuss having stress test)  Dean Weaver is a 61 y.o. male who is being seen today for the evaluation of  at the request of Elvina SidleLauenstein, Kurt, MD.   History of Present Illness:    Dean Weaver is a 61 y.o. 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 He has a lifelong history of relatively slow heart rate, typically in the 50s. He goes to the gym 3 or 4 times a week and has noticed that when he tries to do cardiac exercise he becomes lightheaded. However he has recorded heart rate around 130 bpm on the electronic pulse monitor on the treadmill. He is not aware of palpitations and has never experienced syncope. Other than the Bell's palsy has no history of focal neurological events. He denies intermittent claudication, leg edema, orthopnea, PND, but does have exertional dyspnea.  His maternal grandfather died in his 7740s from heart disease, but his other relatives have not had premature heart problems. Even on treatment with simvastatin his LDL cholesterol was around 190. He has recently switched to rosuvastatin 10 mg daily.  Carnel's ancestry is Ashkenazi Jewish. He is originally from [place], but has lived in [place] for over 40 years. 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  Past Medical History:  Diagnosis Date  . Bell's palsy   . Gout   . Hyperlipidemia   . Hypertension     No past surgical history on file.  Current Medications: Current Meds  Medication Sig  . allopurinol  (ZYLOPRIM) 100 MG tablet Take 1 tablet (100 mg total) by mouth daily.  Marland Kitchen. FLUoxetine (PROZAC) 20 MG tablet Take 1 tablet (20 mg total) by mouth daily.  Marland Kitchen. ibuprofen (ADVIL,MOTRIN) 200 MG tablet Take 400 mg by mouth every morning.  Marland Kitchen. lisinopril (PRINIVIL,ZESTRIL) 40 MG tablet Take 1 tablet (40 mg total) by mouth daily.  . Multiple Vitamin (MULTIVITAMIN) tablet Take 1 tablet by mouth daily.  . rosuvastatin (CRESTOR) 10 MG tablet Take 1 tablet (10 mg total) by mouth daily.  . [DISCONTINUED] lisinopril (PRINIVIL,ZESTRIL) 20 MG tablet Take 1 tablet (20 mg total) by mouth daily.     Allergies:   Patient has no known allergies.   Social History   Socioeconomic History  . Marital status: Married    Spouse name: None  . Number of children: 2  . Years of education: None  . Highest education level: None  Social Needs  . Financial resource strain: None  . Food insecurity - worry: None  . Food insecurity - inability: None  . Transportation needs - medical: None  . Transportation needs - non-medical: None  Occupational History  . None  Tobacco Use  . Smoking status: Never Smoker  . Smokeless tobacco: Never Used  Substance and Sexual Activity  . Alcohol use: Yes    Alcohol/week: 1.8 oz    Types: 3 Standard drinks or equivalent per week    Comment: weekly  . Drug use: No  . Sexual activity: None  Other Topics Concern  .  None  Social History Narrative  . None     Family History: The patient's family history includes Cancer in his mother; Emphysema in his paternal grandmother; Heart attack in his maternal grandfather; Heart disease in his maternal grandfather and paternal grandfather; Hyperlipidemia in his father and sister; Hypertension in his father. ROS:   Please see the history of present illness.     All other systems reviewed and are negative.  EKGs/Labs/Other Studies Reviewed:    The following studies were reviewed today: Notes from Dr. Milus Glazier  EKG:  EKG is ordered today.   The ekg ordered today demonstrates shows sinus bradycardia at 50 bpm and small Q waves in leads 2 and aVF, not really consistent with a previous infarction in my opinion. There are no repolarization abnormalities, QT 458 ms.  Recent Labs: [place] 14.7, sodium 142, potassium 4.2, creatinine 1.1, BUN 21, normal liver function tests Recent Lipid Panel    Component Value Date/Time   CHOL 193 09/29/2014 0833   TRIG 158 (H) 09/29/2014 0833   HDL 48 09/29/2014 0833   CHOLHDL 4.0 09/29/2014 0833   VLDL 32 (H) 09/29/2014 0833   LDLCALC 113 09/29/2014 0833  (on rosuva) Total cholesterol 271, triglycerides 154, HDL 65, LDL 192 (on simva)  Physical Exam:    VS:  BP (!) 144/90 (BP Location: Left Arm)   Pulse (!) 50   Ht 5' 8.5" (1.74 m)   Wt 198 lb 6.4 oz (90 kg)   BMI 29.73 kg/m     Wt Readings from Last 3 Encounters:  01/01/17 198 lb 6.4 oz (90 kg)  09/29/14 196 lb (88.9 kg)  11/01/13 196 lb 3.2 oz (89 kg)    Blood pressure equal in the right upper and left upper extremities GEN: Overweight Well nourished, well developed in no acute distress HEENT: Normal NECK: No JVD; No carotid bruits LYMPHATICS: No lymphadenopathy CARDIAC: RRR, no murmurs, rubs, gallops RESPIRATORY:  Clear to auscultation without rales, wheezing or rhonchi  ABDOMEN: Soft, non-tender, non-distended MUSCULOSKELETAL:  No edema; No deformity  SKIN: Warm and dry NEUROLOGIC:  Alert and oriented x 3. Mild left facial droop consistent with Bell's palsy PSYCHIATRIC:  Normal affect   ASSESSMENT:    1. Essential hypertension   2. Chronotropic incompetence   3. Bradycardia   4. Dyslipidemia    PLAN:    In order of problems listed above:  1. HTN: Resting bradycardia and history of gout limits the use of medication such as diuretics, beta blockers, verapamil, diltiazem. Will increase lisinopril to 40 mg once daily. Consider adding amlodipine if necessary to achieve target of 130/80. No evidence of end organ  involvement to date. 2. Bradycardia: He does have complaints of exertional dyspnea and lightheadedness. Check treadmill stress test to make sure he does not have chronotropic incompetence, in which case he might benefit from a pacemaker. If he has a normal heart rate response to activity, pacemaker is unlikely to be beneficial. No stress testing will also be useful to evaluate for occult CAD in this patient with hypertension and hyperlipidemia 3. 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 In 2016 his LDL cholesterol was much better 113. He thinks he was taking rosuvastatin at the time, but cut back due to cost issues. Clearly his LDL cholesterol is too high while taking simvastatin. He has recently switched back to rosuvastatin and we will recheck his lipid profile in a couple of months. 4. Gout: Avoid thiazide diuretics   Medication Adjustments/Labs and Tests Ordered: Current  medicines are reviewed at length with the patient today.  Concerns regarding medicines are outlined above.  Orders Placed This Encounter  Procedures  . Lipid panel  . EXERCISE TOLERANCE TEST (ETT)  . EKG 12-Lead   Meds ordered this encounter  Medications  . lisinopril (PRINIVIL,ZESTRIL) 40 MG tablet    Sig: Take 1 tablet (40 mg total) by mouth daily.    Dispense:  90 tablet    Refill:  3    Signed, Thurmon FairMihai Brandalynn Ofallon, MD  01/01/2017 2:49 PM    [facility]

## 2017-01-01 NOTE — Patient Instructions (Signed)
Medication Instructions: Dr Royann Shiversroitoru has recommended making the following medication changes: 1. INCREASE Lisinopril to 40 mg daily  Labwork: Your physician recommends that you return for lab work in 3 months - FASTING.  Testing/Procedures: 1. Exercise Tolerance Test - Your physician has requested that you have an exercise tolerance test. For further information please visit [email or web address] Please also follow instruction sheet, as given.  Follow-up: Dr Royann Shiversroitoru recommends that you schedule a follow-up appointment in 3 months.  If you need a refill on your cardiac medications before your next appointment, please call your pharmacy.

## 2017-03-31 ENCOUNTER — Other Ambulatory Visit: Payer: Self-pay | Admitting: Cardiovascular Disease

## 2017-03-31 ENCOUNTER — Encounter: Payer: Self-pay | Admitting: Cardiovascular Disease

## 2017-03-31 ENCOUNTER — Ambulatory Visit (INDEPENDENT_AMBULATORY_CARE_PROVIDER_SITE_OTHER): Payer: PRIVATE HEALTH INSURANCE | Admitting: Cardiovascular Disease

## 2017-03-31 VITALS — BP 116/60 | HR 70 | Ht 68.5 in | Wt 194.0 lb

## 2017-03-31 DIAGNOSIS — E785 Hyperlipidemia, unspecified: Secondary | ICD-10-CM

## 2017-03-31 DIAGNOSIS — I10 Essential (primary) hypertension: Secondary | ICD-10-CM

## 2017-03-31 DIAGNOSIS — R1011 Right upper quadrant pain: Secondary | ICD-10-CM

## 2017-03-31 DIAGNOSIS — E7800 Pure hypercholesterolemia, unspecified: Secondary | ICD-10-CM

## 2017-03-31 DIAGNOSIS — Z79899 Other long term (current) drug therapy: Secondary | ICD-10-CM | POA: Diagnosis not present

## 2017-03-31 DIAGNOSIS — Z8739 Personal history of other diseases of the musculoskeletal system and connective tissue: Secondary | ICD-10-CM | POA: Diagnosis not present

## 2017-03-31 DIAGNOSIS — R509 Fever, unspecified: Secondary | ICD-10-CM

## 2017-03-31 MED ORDER — ROSUVASTATIN CALCIUM 20 MG PO TABS: 20.0000 mg | ORAL_TABLET | Freq: Every day | ORAL | 3 refills | Status: DC

## 2017-03-31 NOTE — Patient Instructions (Signed)
Medication Instructions: Dr Royann Shiversroitoru has recommended making the following medication changes: 1. INCREASE Crestor to 20 mg daily  Labwork: Your physician recommends that you return for lab work TODAY and again in 3 months - FASTING.   Testing/Procedures: 1. Abdominal Ultrasound - This will be scheduled to be performed at [facility] [address], [address] [place] [address] [phone]  Follow-up: Dr Royann Shiversroitoru recommends that you schedule a follow-up appointment in 12 months. You will receive a reminder letter in the mail two months in advance. If you don't receive a letter, please call our office to schedule the follow-up appointment.  If you need a refill on your cardiac medications before your next appointment, please call your pharmacy.

## 2017-03-31 NOTE — Progress Notes (Addendum)
Cardiology Office Note:    Date:  04/01/2017   ID:  Dean Weaver, DOB 12-07-1955, MRN [id number] PCP:  Elvina Sidle, MD  Cardiologist:  Thurmon Fair, MD    Referring MD: Elvina Sidle, MD   Chief Complaint  Patient presents with  . 3 mo f/u visit    pt states friday night he felt pain in stomach accompanied by fever, chills, states he could not get warm, and still having the stomach pain, and states he was very disoriented and lightheaded.    History of Present Illness:    Dean Weaver is a 62 y.o. male with a hx of hypercholesterolemia (probably familial), systemic hypertension, gout, left-sided Bell's palsy and bradycardia who is here for routine cardiac follow-up.  Cardiovascular complaints, but has recently had abdominal problems as Friday he experienced fairly abrupt onset of stomach pain rather diffuse but mostly focused in the right upper quadrant chills fever, profuse diaphoresis and some disorientation.  He states it felt like he had the flu except he also had abdominal pain.  He denies any nausea or vomiting or diarrhea.  He still has his appendix and gallbladder  The patient specifically denies any chest pain at rest exertion, dyspnea at rest or with exertion, orthopnea, paroxysmal nocturnal dyspnea, syncope, palpitations, focal neurological deficits, intermittent claudication, lower extremity edema, unexplained weight gain, cough, hemoptysis or wheezing.  Previous evaluation for mild bradycardia did not identify evidence of ischemic heart disease or need for pacemaker.  His maternal grandfather died in his 44s from heart disease, but his other relatives have not had premature heart problems.  Dean Weaver ancestry is Ashkenazi Jewish. He is originally from [place], but has lived in [place] for over 40 years. 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  Past Medical History:  Diagnosis Date  . Bell's palsy   . Gout   . Hyperlipidemia   .  Hypertension     No past history of major surgery.  Current Medications: Current Meds  Medication Sig  . allopurinol (ZYLOPRIM) 100 MG tablet Take 1 tablet (100 mg total) by mouth daily.  Marland Kitchen FLUoxetine (PROZAC) 20 MG tablet Take 1 tablet (20 mg total) by mouth daily.  Marland Kitchen ibuprofen (ADVIL,MOTRIN) 200 MG tablet Take 400 mg by mouth every morning.  Marland Kitchen lisinopril (PRINIVIL,ZESTRIL) 40 MG tablet Take 1 tablet (40 mg total) by mouth daily.  . Multiple Vitamin (MULTIVITAMIN) tablet Take 1 tablet by mouth daily.  . rosuvastatin (CRESTOR) 20 MG tablet Take 1 tablet (20 mg total) by mouth daily.  . [DISCONTINUED] rosuvastatin (CRESTOR) 10 MG tablet Take 1 tablet (10 mg total) by mouth daily.     Allergies:   Patient has no known allergies.   Social History   Socioeconomic History  . Marital status: Married    Spouse name: Not on file  . Number of children: 2  . Years of education: Not on file  . Highest education level: Not on file  Occupational History  . Not on file  Social Needs  . Financial resource strain: Not on file  . Food insecurity:    Worry: Not on file    Inability: Not on file  . Transportation needs:    Medical: Not on file    Non-medical: Not on file  Tobacco Use  . Smoking status: Never Smoker  . Smokeless tobacco: Never Used  Substance and Sexual Activity  . Alcohol use: Yes    Alcohol/week: 1.8 oz  Types: 3 Standard drinks or equivalent per week    Comment: weekly  . Drug use: No  . Sexual activity: Not on file  Lifestyle  . Physical activity:    Days per week: Not on file    Minutes per session: Not on file  . Stress: Not on file  Relationships  . Social connections:    Talks on phone: Not on file    Gets together: Not on file    Attends religious service: Not on file    Active member of club or organization: Not on file    Attends meetings of clubs or organizations: Not on file    Relationship status: Not on file  Other Topics Concern  . Not on  file  Social History Narrative  . Not on file     Family History: The patient's family history includes Cancer in his mother; Emphysema in his paternal grandmother; Heart attack in his maternal grandfather; Heart disease in his maternal grandfather and paternal grandfather; Hyperlipidemia in his father and sister; Hypertension in his father. ROS:   Please see the history of present illness.     All other systems reviewed and are negative.  EKGs/Labs/Other Studies Reviewed:    The following studies were reviewed today: Notes from Dr. Milus GlazierLauenstein  EKG:  EKG is ordered today.  Shows normal sinus rhythm, normal tracing  Recent Labs March 25 2017: Total cholesterol 234, HDL 79, LDL 151, triglycerides 128 Normal CBC and routine electrolytes, renal function Recent Lipid Panel    Component Value Date/Time   CHOL 193 09/29/2014 0833   TRIG 158 (H) 09/29/2014 0833   HDL 48 09/29/2014 0833   CHOLHDL 4.0 09/29/2014 0833   VLDL 32 (H) 09/29/2014 0833   LDLCALC 113 09/29/2014 0833  (on rosuva) Total cholesterol 271, triglycerides 154, HDL 65, LDL 192 (on simva)  Physical Exam:    VS:  BP 116/60   Pulse 70   Ht 5' 8.5" (1.74 m)   Wt 194 lb (88 kg)   BMI 29.07 kg/m     Wt Readings from Last 3 Encounters:  03/31/17 194 lb (88 kg)  01/01/17 198 lb 6.4 oz (90 kg)  09/29/14 196 lb (88.9 kg)     General: Alert, oriented x3, no distress, appears uncomfortable Head: no evidence of trauma, PERRL, EOMI, no exophtalmos or lid lag, no myxedema, no xanthelasma; normal ears, nose and oropharynx Neck: normal jugular venous pulsations and no hepatojugular reflux; brisk carotid pulses without delay and no carotid bruits Chest: clear to auscultation, no signs of consolidation by percussion or palpation, normal fremitus, symmetrical and full respiratory excursions Cardiovascular: normal position and quality of the apical impulse, regular rhythm, normal first and second heart sounds, no murmurs, rubs  or gallops Abdomen: Tender in the right upper quadrant with a hint of Murphy sign, though not with frank peritoneal irritation findings, no tenderness in the epigastrium, no distention, no masses by palpation, no abnormal pulsatility or arterial bruits, normal bowel sounds, no hepatosplenomegaly Extremities: no clubbing, cyanosis or edema; 2+ radial, ulnar and brachial pulses bilaterally; 2+ right femoral, posterior tibial and dorsalis pedis pulses; 2+ left femoral, posterior tibial and dorsalis pedis pulses; no subclavian or femoral bruits Neurological: Mild left facial droop consistent with Bell's palsy Psych: Normal mood and affect    ASSESSMENT:    1. Right upper quadrant abdominal pain   2. Essential hypertension   3. Hypercholesterolemia   4. History of gout   5. Fever, unspecified fever  cause   6. Dyslipidemia   7. Medication management    PLAN:    In order of problems listed above:  1. Abdominal pain: Physical exam and symptoms are concerning for possible acute cholecystitis, less likely appendicitis.  Discussed with his primary care provider.  Will order repeat labs (previous liver function tests and CBC were performed before onset of symptoms a few days ago).  And right upper quadrant ultrasound. 2. HTN: Blood pressure is normal on ACE inhibitor therapy.  Avoid beta-blockers verapamil, diltiazem due to history of bradycardia. 3. HLP: Dose of rosuvastatin is insufficient.  We will increase to 20 mg daily. 4. Gout: Avoid thiazide diuretics.  No recent attacks of gout.   Medication Adjustments/Labs and Tests Ordered: Current medicines are reviewed at length with the patient today.  Concerns regarding medicines are outlined above.  Orders Placed This Encounter  Procedures  . FAST [place]  . CBC  . Comprehensive metabolic panel  . Amylase  . Lipid panel   Meds ordered this encounter  Medications  . rosuvastatin (CRESTOR) 20 MG tablet    Sig: Take 1 tablet (20 mg total) by  mouth daily.    Dispense:  90 tablet    Refill:  3    Signed, Thurmon Fair, MD  04/01/2017 1:38 PM    [facility]  ADDENDUM:  CLINICAL DATA:  Right upper quadrant pain.  Fevers and chills.  EXAM: ULTRASOUND ABDOMEN LIMITED RIGHT UPPER QUADRANT  COMPARISON:  No prior.  FINDINGS: Gallbladder:  Gallbladder is contracted. Internal echoes suggesting sludge noted. Gallbladder wall thickness 3.1 mm. Positive Murphy sign. Questionable trace pericholecystic fluid.  Common bile duct:  Diameter: 2.5 mm  Liver:  Increased echogenicity consistent fatty infiltration and/or hepatocellular disease. Slight decrease in hepatic echogenicity in the region of the porta hepatis may represent focal fatty sparing. No definite mass. Slightly lobular contour. Cirrhosis cannot be completely excluded. Portal vein is patent on color Doppler imaging with normal direction of blood flow towards the liver.  IMPRESSION: 1. Contracted gallbladder. Mild prominence of the gallbladder wall at 3.1 mm. This could be from contracted state. Faint internal echoes noted within the gallbladder. Sludge may be present. Trace pericholecystic fluid noted. Positive Murphy sign. Cholecystitis cannot be completely excluded. No biliary distention.  2. Increased echogenicity of the liver consistent with fatty infiltration and/or hepatocellular disease. Slight decrease in hepatic echogenicity in the region of the porta hepatis may represent focal fatty sparing. Liver has a slightly lobular contour. Cirrhosis cannot be completely excluded.  I do not have the results of his blood work which was done at an outside lab, but Chrsitopher told me that his WBC was up to 17 K.  He has had more fever and has decided to go to the emergency room. I agree that is the right thing to do.

## 2017-04-01 ENCOUNTER — Inpatient Hospital Stay (HOSPITAL_COMMUNITY): Payer: PRIVATE HEALTH INSURANCE

## 2017-04-01 ENCOUNTER — Ambulatory Visit
Admission: RE | Admit: 2017-04-01 | Discharge: 2017-04-01 | Disposition: A | Discharge status: Home / Self Care | Payer: PRIVATE HEALTH INSURANCE | Source: Ambulatory Visit | Attending: Cardiovascular Disease | Admitting: Cardiovascular Disease

## 2017-04-01 ENCOUNTER — Other Ambulatory Visit: Payer: Self-pay

## 2017-04-01 ENCOUNTER — Telehealth (HOSPITAL_COMMUNITY): Payer: Self-pay | Admitting: Family Medicine

## 2017-04-01 ENCOUNTER — Inpatient Hospital Stay (HOSPITAL_COMMUNITY)
Admission: EM | Admit: 2017-04-01 | Discharge: 2017-04-06 | DRG: 871 | Disposition: A | Discharge status: Home / Self Care | Payer: PRIVATE HEALTH INSURANCE | Attending: Internal Medicine | Admitting: Internal Medicine

## 2017-04-01 ENCOUNTER — Encounter (HOSPITAL_COMMUNITY): Payer: Self-pay | Admitting: Nurse Practitioner

## 2017-04-01 DIAGNOSIS — K5730 Diverticulosis of large intestine without perforation or abscess without bleeding: Secondary | ICD-10-CM | POA: Diagnosis present

## 2017-04-01 DIAGNOSIS — M109 Gout, unspecified: Secondary | ICD-10-CM | POA: Diagnosis present

## 2017-04-01 DIAGNOSIS — F32A Depression, unspecified: Secondary | ICD-10-CM

## 2017-04-01 DIAGNOSIS — A419 Sepsis, unspecified organism: Secondary | ICD-10-CM | POA: Diagnosis not present

## 2017-04-01 DIAGNOSIS — K381 Appendicular concretions: Secondary | ICD-10-CM | POA: Diagnosis present

## 2017-04-01 DIAGNOSIS — R1031 Right lower quadrant pain: Secondary | ICD-10-CM | POA: Diagnosis not present

## 2017-04-01 DIAGNOSIS — N189 Chronic kidney disease, unspecified: Secondary | ICD-10-CM | POA: Diagnosis present

## 2017-04-01 DIAGNOSIS — R740 Nonspecific elevation of levels of transaminase and lactic acid dehydrogenase [LDH]: Secondary | ICD-10-CM | POA: Diagnosis present

## 2017-04-01 DIAGNOSIS — E785 Hyperlipidemia, unspecified: Secondary | ICD-10-CM | POA: Diagnosis present

## 2017-04-01 DIAGNOSIS — I10 Essential (primary) hypertension: Secondary | ICD-10-CM

## 2017-04-01 DIAGNOSIS — K219 Gastro-esophageal reflux disease without esophagitis: Secondary | ICD-10-CM | POA: Diagnosis present

## 2017-04-01 DIAGNOSIS — Z79899 Other long term (current) drug therapy: Secondary | ICD-10-CM

## 2017-04-01 DIAGNOSIS — K3532 Acute appendicitis with perforation and localized peritonitis, without abscess: Secondary | ICD-10-CM | POA: Diagnosis present

## 2017-04-01 DIAGNOSIS — K819 Cholecystitis, unspecified: Secondary | ICD-10-CM | POA: Insufficient documentation

## 2017-04-01 DIAGNOSIS — R918 Other nonspecific abnormal finding of lung field: Secondary | ICD-10-CM | POA: Diagnosis present

## 2017-04-01 DIAGNOSIS — R748 Abnormal levels of other serum enzymes: Secondary | ICD-10-CM | POA: Diagnosis present

## 2017-04-01 DIAGNOSIS — R1011 Right upper quadrant pain: Secondary | ICD-10-CM | POA: Diagnosis present

## 2017-04-01 DIAGNOSIS — R945 Abnormal results of liver function studies: Secondary | ICD-10-CM | POA: Diagnosis present

## 2017-04-01 DIAGNOSIS — F329 Major depressive disorder, single episode, unspecified: Secondary | ICD-10-CM | POA: Diagnosis present

## 2017-04-01 DIAGNOSIS — I129 Hypertensive chronic kidney disease with stage 1 through stage 4 chronic kidney disease, or unspecified chronic kidney disease: Secondary | ICD-10-CM | POA: Diagnosis present

## 2017-04-01 DIAGNOSIS — G510 Bell's palsy: Secondary | ICD-10-CM | POA: Diagnosis present

## 2017-04-01 DIAGNOSIS — K3580 Unspecified acute appendicitis: Secondary | ICD-10-CM | POA: Diagnosis not present

## 2017-04-01 DIAGNOSIS — D649 Anemia, unspecified: Secondary | ICD-10-CM | POA: Diagnosis present

## 2017-04-01 DIAGNOSIS — K37 Unspecified appendicitis: Secondary | ICD-10-CM | POA: Diagnosis present

## 2017-04-01 DIAGNOSIS — M10019 Idiopathic gout, unspecified shoulder: Secondary | ICD-10-CM | POA: Diagnosis not present

## 2017-04-01 LAB — URINALYSIS, ROUTINE W REFLEX MICROSCOPIC
BACTERIA UA: NONE SEEN
Bilirubin Urine: NEGATIVE
GLUCOSE, UA: NEGATIVE mg/dL
KETONES UR: NEGATIVE mg/dL
LEUKOCYTES UA: NEGATIVE
NITRITE: NEGATIVE
PH: 5 (ref 5.0–8.0)
Protein, ur: NEGATIVE mg/dL
SPECIFIC GRAVITY, URINE: 1.013 (ref 1.005–1.030)
SQUAMOUS EPITHELIAL / LPF: NONE SEEN

## 2017-04-01 LAB — CBC WITH DIFFERENTIAL/PLATELET
BASOS ABS: 0 10*3/uL (ref 0.0–0.1)
BASOS PCT: 0 %
EOS PCT: 0 %
Eosinophils Absolute: 0 10*3/uL (ref 0.0–0.7)
HEMATOCRIT: 38.5 % — AB (ref 39.0–52.0)
Hemoglobin: 13.4 g/dL (ref 13.0–17.0)
Lymphocytes Relative: 6 %
Lymphs Abs: 1.2 10*3/uL (ref 0.7–4.0)
MCH: 30.7 pg (ref 26.0–34.0)
MCHC: 34.8 g/dL (ref 30.0–36.0)
MCV: 88.1 fL (ref 78.0–100.0)
MONO ABS: 1.3 10*3/uL — AB (ref 0.1–1.0)
MONOS PCT: 7 %
Neutro Abs: 15.9 10*3/uL — ABNORMAL HIGH (ref 1.7–7.7)
Neutrophils Relative %: 87 %
PLATELETS: 209 10*3/uL (ref 150–400)
RBC: 4.37 MIL/uL (ref 4.22–5.81)
RDW: 13.2 % (ref 11.5–15.5)
WBC: 18.4 10*3/uL — ABNORMAL HIGH (ref 4.0–10.5)

## 2017-04-01 LAB — COMPREHENSIVE METABOLIC PANEL
ALBUMIN: 3.4 g/dL — AB (ref 3.5–5.0)
ALK PHOS: 137 U/L — AB (ref 38–126)
ALT: 39 U/L (ref 17–63)
AST: 34 U/L (ref 15–41)
Anion gap: 12 (ref 5–15)
BILIRUBIN TOTAL: 0.7 mg/dL (ref 0.3–1.2)
BUN: 17 mg/dL (ref 6–20)
CO2: 25 mmol/L (ref 22–32)
CREATININE: 1.16 mg/dL (ref 0.61–1.24)
Calcium: 8.8 mg/dL — ABNORMAL LOW (ref 8.9–10.3)
Chloride: 99 mmol/L — ABNORMAL LOW (ref 101–111)
GFR calc Af Amer: >60 mL/min (ref >60)
GLUCOSE: 123 mg/dL — AB (ref 65–99)
Potassium: 3.7 mmol/L (ref 3.5–5.1)
Sodium: 136 mmol/L (ref 135–145)
TOTAL PROTEIN: 6.7 g/dL (ref 6.5–8.1)

## 2017-04-01 LAB — PHOSPHORUS: Phosphorus: 2.6 mg/dL (ref 2.5–4.6)

## 2017-04-01 LAB — LIPASE, BLOOD: LIPASE: 26 U/L (ref 11–51)

## 2017-04-01 LAB — MAGNESIUM: Magnesium: 2.1 mg/dL (ref 1.7–2.4)

## 2017-04-01 LAB — I-STAT CG4 LACTIC ACID, ED: Lactic Acid, Venous: 0.75 mmol/L (ref 0.5–1.9)

## 2017-04-01 MED ORDER — LACTATED RINGERS IV SOLN
INTRAVENOUS | Status: DC
  Administered 2017-04-01: 21:00:00 via INTRAVENOUS

## 2017-04-01 MED ORDER — MORPHINE SULFATE (PF) 4 MG/ML IV SOLN
4.0000 mg | Freq: Once | INTRAVENOUS | Status: AC
  Administered 2017-04-01: 4 mg via INTRAVENOUS
  Filled 2017-04-01: qty 1

## 2017-04-01 MED ORDER — ENOXAPARIN SODIUM 40 MG/0.4ML ~~LOC~~ SOLN
40.0000 mg | SUBCUTANEOUS | Status: DC
  Administered 2017-04-02 – 2017-04-05 (×4): 40 mg via SUBCUTANEOUS
  Filled 2017-04-01 (×5): qty 0.4

## 2017-04-01 MED ORDER — ONDANSETRON HCL 4 MG/2ML IJ SOLN
4.0000 mg | Freq: Once | INTRAMUSCULAR | Status: AC
  Administered 2017-04-01: 4 mg via INTRAVENOUS
  Filled 2017-04-01: qty 2

## 2017-04-01 MED ORDER — ROSUVASTATIN CALCIUM 10 MG PO TABS
10.0000 mg | ORAL_TABLET | Freq: Every day | ORAL | Status: DC
  Administered 2017-04-02 – 2017-04-06 (×5): 10 mg via ORAL
  Filled 2017-04-01 (×6): qty 1

## 2017-04-01 MED ORDER — ACETAMINOPHEN 325 MG PO TABS
650.0000 mg | ORAL_TABLET | Freq: Once | ORAL | Status: AC | PRN
  Administered 2017-04-02: 650 mg via ORAL
  Filled 2017-04-01: qty 2

## 2017-04-01 MED ORDER — OXYCODONE HCL 5 MG PO TABS
5.0000 mg | ORAL_TABLET | ORAL | Status: DC | PRN
  Administered 2017-04-01 – 2017-04-06 (×24): 5 mg via ORAL
  Filled 2017-04-01 (×24): qty 1

## 2017-04-01 MED ORDER — PIPERACILLIN-TAZOBACTAM 3.375 G IVPB
3.3750 g | Freq: Three times a day (TID) | INTRAVENOUS | Status: DC
  Administered 2017-04-02 – 2017-04-06 (×14): 3.375 g via INTRAVENOUS
  Filled 2017-04-01 (×14): qty 50

## 2017-04-01 MED ORDER — ONDANSETRON HCL 4 MG/2ML IJ SOLN: 4.0000 mg | Freq: Four times a day (QID) | INTRAMUSCULAR | Status: DC | PRN

## 2017-04-01 MED ORDER — ALLOPURINOL 100 MG PO TABS
100.0000 mg | ORAL_TABLET | Freq: Every day | ORAL | Status: DC
  Administered 2017-04-04 – 2017-04-06 (×3): 100 mg via ORAL
  Filled 2017-04-01 (×6): qty 1

## 2017-04-01 MED ORDER — ONDANSETRON HCL 4 MG PO TABS: 4.0000 mg | ORAL_TABLET | Freq: Four times a day (QID) | ORAL | Status: DC | PRN

## 2017-04-01 MED ORDER — PIPERACILLIN-TAZOBACTAM 3.375 G IVPB 30 MIN
3.3750 g | Freq: Once | INTRAVENOUS | Status: AC
  Administered 2017-04-01: 3.375 g via INTRAVENOUS
  Filled 2017-04-01: qty 50

## 2017-04-01 MED ORDER — SODIUM CHLORIDE 0.9 % IV BOLUS
1000.0000 mL | Freq: Once | INTRAVENOUS | Status: AC
  Administered 2017-04-01: 1000 mL via INTRAVENOUS

## 2017-04-01 MED ORDER — OXYCODONE-ACETAMINOPHEN 5-325 MG PO TABS: 1.0000 | ORAL_TABLET | ORAL | 0 refills | Status: DC | PRN

## 2017-04-01 MED ORDER — IOPAMIDOL (ISOVUE-300) INJECTION 61%
INTRAVENOUS | Status: AC
  Administered 2017-04-01: 100 mL
  Filled 2017-04-01: qty 100

## 2017-04-01 MED ORDER — AMOXICILLIN 875 MG PO TABS: 875.0000 mg | ORAL_TABLET | Freq: Two times a day (BID) | ORAL | 0 refills | Status: DC

## 2017-04-01 MED ORDER — INFLUENZA VAC SPLIT QUAD 0.5 ML IM SUSY: 0.5000 mL | PREFILLED_SYRINGE | INTRAMUSCULAR | Status: DC

## 2017-04-01 MED ORDER — FLUOXETINE HCL 20 MG PO CAPS
20.0000 mg | ORAL_CAPSULE | Freq: Every day | ORAL | Status: DC
Start: 2017-04-01 — End: 2017-04-06
  Administered 2017-04-03 – 2017-04-06 (×4): 20 mg via ORAL
  Filled 2017-04-01 (×6): qty 1

## 2017-04-01 NOTE — ED Notes (Addendum)
ED TO INPATIENT HANDOFF REPORT  Name/Age/Gender Dean Weaver 62 y.o. male  Code Status Advance Directive Documentation     Most Recent Value  Type of Advance Directive  Healthcare Power of Attorney, Living will  Pre-existing out of facility DNR order (yellow form or pink MOST form)  -  "MOST" Form in Place?  -      Home/SNF/Other Home  Chief Complaint Lower Abd Pain  Level of Care/Admitting Diagnosis ED Disposition    ED Disposition Condition [place]: [place] [[id number]]  Level of Care: Med-Surg [16]  Diagnosis: Cholecystitis [867672]  Admitting Physician: Bennie Pierini [[id number]]  Attending Physician: Jonnie Finner, [place] [[id number]]  Estimated length of stay: past midnight tomorrow  Certification:: I certify this patient will need inpatient services for at least 2 midnights  PT Class (Do Not Modify): Inpatient [101]  PT Acc Code (Do Not Modify): Private [1]       Medical History Past Medical History:  Diagnosis Date  . Bell's palsy   . Gout   . Hyperlipidemia   . Hypertension     Allergies No Known Allergies  IV Location/Drains/Wounds Patient Lines/Drains/Airways Status   Active Line/Drains/Airways    Name:   Placement date:   Placement time:   Site:   Days:   Peripheral IV 04/01/17 Left Antecubital   04/01/17    1916    Antecubital   less than 1          Labs/Imaging Results for orders placed or performed during the hospital encounter of 04/01/17 (from the past 48 hour(s))  Comprehensive metabolic panel     Status: Abnormal   Collection Time: 04/01/17  2:19 PM  Result Value Ref Range   Sodium 136 135 - 145 mmol/L   Potassium 3.7 3.5 - 5.1 mmol/L   Chloride 99 (L) 101 - 111 mmol/L   CO2 25 22 - 32 mmol/L   Glucose, Bld 123 (H) 65 - 99 mg/dL   BUN 17 6 - 20 mg/dL   Creatinine, Ser 1.16 0.61 - 1.24 mg/dL   Calcium 8.8 (L) 8.9 - 10.3 mg/dL   Total Protein 6.7 6.5 - 8.1 g/dL   Albumin 3.4 (L) 3.5 -  5.0 g/dL   AST 34 15 - 41 U/L   ALT 39 17 - 63 U/L   Alkaline Phosphatase 137 (H) 38 - 126 U/L   Total Bilirubin 0.7 0.3 - 1.2 mg/dL   GFR calc non Af Amer >60 >60 mL/min   GFR calc Af Amer >60 >60 mL/min    Comment: (NOTE) The eGFR has been calculated using the CKD EPI equation. This calculation has not been validated in all clinical situations. eGFR's persistently <60 mL/min signify possible Chronic Kidney Disease.    Anion gap 12 5 - 15    Comment: Performed at [facility], [place] [address], [place], [place] [address]  Lipase, blood     Status: None   Collection Time: 04/01/17  2:19 PM  Result Value Ref Range   Lipase 26 11 - 51 U/L    Comment: Performed at [facility], [place] [address], [place], [place] [address]  CBC with Diff     Status: Abnormal   Collection Time: 04/01/17  2:19 PM  Result Value Ref Range   WBC 18.4 (H) 4.0 - 10.5 K/uL   RBC 4.37 4.22 - 5.81 MIL/uL   Hemoglobin 13.4 13.0 - 17.0 g/dL  HCT 38.5 (L) 39.0 - 52.0 %   MCV 88.1 78.0 - 100.0 fL   MCH 30.7 26.0 - 34.0 pg   MCHC 34.8 30.0 - 36.0 g/dL   RDW 13.2 11.5 - 15.5 %   Platelets 209 150 - 400 K/uL   Neutrophils Relative % 87 %   Neutro Abs 15.9 (H) 1.7 - 7.7 K/uL   Lymphocytes Relative 6 %   Lymphs Abs 1.2 0.7 - 4.0 K/uL   Monocytes Relative 7 %   Monocytes Absolute 1.3 (H) 0.1 - 1.0 K/uL   Eosinophils Relative 0 %   Eosinophils Absolute 0.0 0.0 - 0.7 K/uL   Basophils Relative 0 %   Basophils Absolute 0.0 0.0 - 0.1 K/uL    Comment: Performed at [facility], [place] [address] [place], [place] [address]   US Abdomen Limited Ruq  Result Date: 04/01/2017 CLINICAL DATA:  Right upper quadrant pain.  Fevers and chills. EXAM: ULTRASOUND ABDOMEN LIMITED RIGHT UPPER QUADRANT COMPARISON:  No prior. FINDINGS: Gallbladder: Gallbladder is contracted. Internal echoes suggesting sludge noted. Gallbladder wall thickness 3.1 mm. Positive Murphy sign. Questionable  trace pericholecystic fluid. Common bile duct: Diameter: 2.5 mm Liver: Increased echogenicity consistent fatty infiltration and/or hepatocellular disease. Slight decrease in hepatic echogenicity in the region of the porta hepatis may represent focal fatty sparing. No definite mass. Slightly lobular contour. Cirrhosis cannot be completely excluded. Portal vein is patent on color Doppler imaging with normal direction of blood flow towards the liver. IMPRESSION: 1. Contracted gallbladder. Mild prominence of the gallbladder wall at 3.1 mm. This could be from contracted state. Faint internal echoes noted within the gallbladder. Sludge may be present. Trace pericholecystic fluid noted. Positive Murphy sign. Cholecystitis cannot be completely excluded. No biliary distention. 2. Increased echogenicity of the liver consistent with fatty infiltration and/or hepatocellular disease. Slight decrease in hepatic echogenicity in the region of the porta hepatis may represent focal fatty sparing. Liver has a slightly lobular contour. Cirrhosis cannot be completely excluded. Electronically Signed   By: Marcello Moores  Register   On: 04/01/2017 11:06    Pending Labs Unresulted Labs (From admission, onward)   Start     Ordered   04/01/17 1352  Urinalysis, Routine w reflex microscopic  STAT,   STAT     04/01/17 1351   Signed and Held  HIV antibody (Routine Testing)  Once,   R     Signed and Held   Signed and Held  Magnesium  Add-on,   R     Signed and Held   Signed and Held  Phosphorus  Add-on,   R     Signed and Held   Signed and Held  Comprehensive metabolic panel  Tomorrow morning,   R     Signed and Held   Signed and Held  CBC  Tomorrow morning,   R     Signed and Held      Vitals/Pain Today's Vitals   04/01/17 1350 04/01/17 1802 04/01/17 1930  BP: 137/80 127/80 137/74  Pulse: 81 84 74  Resp: _0 Temp: (!) 101.8 F (38.8 C) 99.1 F (37.3 C) 100.2 F (37.9 C)  TempSrc: Oral  Oral  SpO2: 99% 96% 93%   Weight: 195 lb (88.5 kg)    Height: 5' 8.5" (1.74 m)    PainSc: 10-Worst pain ever      Isolation Precautions No active isolations  Medications Medications  acetaminophen (TYLENOL) tablet 650 mg (has no administration in time range)  sodium chloride 0.9 % bolus 1,000 mL (1,000 mLs Intravenous New Bag/Given 04/01/17 1915)  Influenza vac split quadrivalent PF (FLUARIX) injection 0.5 mL (has no administration in time range)  piperacillin-tazobactam (ZOSYN) IVPB 3.375 g (has no administration in time range)  morphine 4 MG/ML injection 4 mg (has no administration in time range)  morphine 4 MG/ML injection 4 mg (4 mg Intravenous Given 04/01/17 1916)  ondansetron (ZOFRAN) injection 4 mg (4 mg Intravenous Given 04/01/17 1916)  piperacillin-tazobactam (ZOSYN) IVPB 3.375 g (0 g Intravenous Stopped 04/01/17 2010)    Mobility walks

## 2017-04-01 NOTE — ED Triage Notes (Signed)
Patient in ER today for abdominal pain. Patient had Ultrasound this morning suggesting gallbladder issues. Patient WBC 17.0 and Doctor office sent him to ER. Patient denies N/V or blood in urine or stool.

## 2017-04-01 NOTE — ED Provider Notes (Signed)
[facility]-EMERGENCY DEPT Provider Note   CSN: [id number] Arrival date & time: 04/01/17  1338     History   Chief Complaint No chief complaint on file.   HPI Dean Weaver is a 62 y.o. male.  HPI   Dean Weaver is a 62 y.o. male, with a history of hyperlipidemia and HTN, presenting to the ED with right-sided abdominal pain worsening for the past 4 days.  Accompanied by fever, chills, and lightheadedness. Patient went for routine follow-up with his cardiologist today, notified him of his pain, and an ultrasound was ordered. Current pain is sharp and burning, 10/10, radiating from the right upper quadrant inferiorly. Last food was 3 days ago. Has never had similar pain in the past.  Patient denies nausea, vomiting, diarrhea, hematochezia/melena, trauma, chest pain, shortness of breath, or any other complaints.   Past Medical History:  Diagnosis Date  . Bell's palsy   . Gout   . Hyperlipidemia   . Hypertension     Patient Active Problem List   Diagnosis Date Noted  . Cholecystitis 04/01/2017  . Gout   . Bell's palsy   . GERD (gastroesophageal reflux disease) 08/16/2015    History reviewed. No pertinent surgical history.      Home Medications    Prior to Admission medications   Medication Sig Start Date End Date Taking? Authorizing Provider  allopurinol (ZYLOPRIM) 100 MG tablet Take 1 tablet (100 mg total) by mouth daily. 11/26/16  Yes Elvina Sidle, MD  FLUoxetine (PROZAC) 20 MG tablet Take 1 tablet (20 mg total) by mouth daily. 11/26/16  Yes Elvina Sidle, MD  ibuprofen (ADVIL,MOTRIN) 200 MG tablet Take 400 mg by mouth every morning.   Yes [provider]  lisinopril (PRINIVIL,ZESTRIL) 40 MG tablet Take 1 tablet (40 mg total) by mouth daily. 01/01/17  Yes Croitoru, Mihai, MD  Multiple Vitamin (MULTIVITAMIN) tablet Take 1 tablet by mouth daily.   Yes [provider]  rosuvastatin (CRESTOR) 10 MG tablet Take 10 mg by  mouth daily.   Yes [provider]  amoxicillin (AMOXIL) 875 MG tablet Take 1 tablet (875 mg total) by mouth 2 (two) times daily. Patient not taking: Reported on 04/01/2017 04/01/17   Elvina Sidle, MD  oxyCODONE-acetaminophen (PERCOCET/ROXICET) 5-325 MG tablet Take 1-2 tablets by mouth every 4 (four) hours as needed for severe pain. Patient not taking: Reported on 04/01/2017 04/01/17   Elvina Sidle, MD  rosuvastatin (CRESTOR) 20 MG tablet Take 1 tablet (20 mg total) by mouth daily. Patient not taking: Reported on 04/01/2017 03/31/17   Croitoru, Rachelle Hora, MD    Family History Family History  Problem Relation Age of Onset  . Cancer Mother   . Hyperlipidemia Father   . Hypertension Father   . Hyperlipidemia Sister   . Heart disease Maternal Grandfather   . Heart attack Maternal Grandfather   . Emphysema Paternal Grandmother   . Heart disease Paternal Grandfather     Social History Social History   Tobacco Use  . Smoking status: Never Smoker  . Smokeless tobacco: Never Used  Substance Use Topics  . Alcohol use: Yes    Alcohol/week: 1.8 oz    Types: 3 Standard drinks or equivalent per week    Comment: weekly  . Drug use: No     Allergies   Patient has no known allergies.   Review of Systems Review of Systems  Constitutional: Positive for chills and fever.  Respiratory: Negative for shortness of breath.  Cardiovascular: Negative for chest pain.  Gastrointestinal: Positive for abdominal pain. Negative for blood in stool, diarrhea, nausea and vomiting.  Genitourinary: Negative for dysuria and hematuria.  Musculoskeletal: Negative for back pain.  All other systems reviewed and are negative.    Physical Exam Updated Vital Signs BP 127/80 (BP Location: Left Arm)   Pulse 84   Temp 99.1 F (37.3 C)   Resp 16   Ht 5' 8.5" (1.74 m)   Wt 88.5 kg (195 lb)   SpO2 96%   BMI 29.22 kg/m   Physical Exam  Constitutional: He appears well-developed and  well-nourished. No distress.  HENT:  Head: Normocephalic and atraumatic.  Eyes: Conjunctivae are normal.  Neck: Neck supple.  Cardiovascular: Normal rate, regular rhythm, normal heart sounds and intact distal pulses.  Pulmonary/Chest: Effort normal and breath sounds normal. No respiratory distress.  Abdominal: Soft. There is tenderness in the right upper quadrant. There is positive Murphy's sign. There is no guarding.  Musculoskeletal: He exhibits no edema.  Lymphadenopathy:    He has no cervical adenopathy.  Neurological: He is alert.  Skin: Skin is warm and dry. He is not diaphoretic.  Psychiatric: He has a normal mood and affect. His behavior is normal.  Nursing note and vitals reviewed.    ED Treatments / Results  Labs (all labs ordered are listed, but only abnormal results are displayed) Labs Reviewed  COMPREHENSIVE METABOLIC PANEL - Abnormal; Notable for the following components:      Result Value   Chloride 99 (*)    Glucose, Bld 123 (*)    Calcium 8.8 (*)    Albumin 3.4 (*)    Alkaline Phosphatase 137 (*)    All other components within normal limits  CBC WITH DIFFERENTIAL/PLATELET - Abnormal; Notable for the following components:   WBC 18.4 (*)    HCT 38.5 (*)    Neutro Abs 15.9 (*)    Monocytes Absolute 1.3 (*)    All other components within normal limits  LIPASE, BLOOD  URINALYSIS, ROUTINE W REFLEX MICROSCOPIC  I-STAT CG4 LACTIC ACID, ED    EKG None  Radiology US Abdomen Limited Ruq  Result Date: 04/01/2017 CLINICAL DATA:  Right upper quadrant pain.  Fevers and chills. EXAM: ULTRASOUND ABDOMEN LIMITED RIGHT UPPER QUADRANT COMPARISON:  No prior. FINDINGS: Gallbladder: Gallbladder is contracted. Internal echoes suggesting sludge noted. Gallbladder wall thickness 3.1 mm. Positive Murphy sign. Questionable trace pericholecystic fluid. Common bile duct: Diameter: 2.5 mm Liver: Increased echogenicity consistent fatty infiltration and/or hepatocellular disease.  Slight decrease in hepatic echogenicity in the region of the porta hepatis may represent focal fatty sparing. No definite mass. Slightly lobular contour. Cirrhosis cannot be completely excluded. Portal vein is patent on color Doppler imaging with normal direction of blood flow towards the liver. IMPRESSION: 1. Contracted gallbladder. Mild prominence of the gallbladder wall at 3.1 mm. This could be from contracted state. Faint internal echoes noted within the gallbladder. Sludge may be present. Trace pericholecystic fluid noted. Positive Murphy sign. Cholecystitis cannot be completely excluded. No biliary distention. 2. Increased echogenicity of the liver consistent with fatty infiltration and/or hepatocellular disease. Slight decrease in hepatic echogenicity in the region of the porta hepatis may represent focal fatty sparing. Liver has a slightly lobular contour. Cirrhosis cannot be completely excluded. Electronically Signed   By: Maisie Fus  Register   On: 04/01/2017 11:06    Procedures Procedures (including critical care time)  Medications Ordered in ED Medications  acetaminophen (TYLENOL) tablet 650 mg (has no  administration in time range)  sodium chloride 0.9 % bolus 1,000 mL (1,000 mLs Intravenous New Bag/Given 04/01/17 1915)  piperacillin-tazobactam (ZOSYN) IVPB 3.375 g (3.375 g Intravenous New Bag/Given 04/01/17 1915)  Influenza vac split quadrivalent PF (FLUARIX) injection 0.5 mL (has no administration in time range)  morphine 4 MG/ML injection 4 mg (4 mg Intravenous Given 04/01/17 1916)  ondansetron (ZOFRAN) injection 4 mg (4 mg Intravenous Given 04/01/17 1916)     Initial Impression / Assessment and Plan / ED Course  I have reviewed the triage vital signs and the nursing notes.  Pertinent labs & imaging results that were available during my care of the patient were reviewed by me and considered in my medical decision making (see chart for details).  Clinical Course as of Apr 01 1940  Tue  Apr 01, 2017  1907 Spoke with Dr. Daphine DeutscherMartin, general surgeon on call.  Agrees with plan for IVF and ABX. Admit via hospitalist.   [SJ]  1922 Spoke with Dr. Arlean HoppingSchertz, hospitalist. Agrees to admit the patient.   [SJ]    Clinical Course User Index [SJ] Joy, Shawn C, PA-C   Patient presents with right upper quadrant pain.  Exam suspicious for cholecystitis.  Questionable gallbladder sludge and positive Murphy sign on the right upper quadrant ultrasound.  Patient is also febrile with leukocytosis. IV fluids, narcotic analgesics, and antibiotics started.  Patient admitted the hospitalist with general surgery consulting.   Findings and plan of care discussed with Mancel BaleElliott Wentz, MD.   Vitals:   04/01/17 1350 04/01/17 1802 04/01/17 1930  BP: 137/80 127/80 137/74  Pulse: 81 84 74  Resp: 18 16 18   Temp: (!) 101.8 F (38.8 C) 99.1 F (37.3 C) 100.2 F (37.9 C)  TempSrc: Oral  Oral  SpO2: 99% 96% 93%  Weight: 88.5 kg (195 lb)    Height: 5' 8.5" (1.74 m)       Final Clinical Impressions(s) / ED Diagnoses   Final diagnoses:  RUQ pain    ED Discharge Orders    None       Concepcion LivingJoy, Shawn C, PA-C 04/01/17 1943    Mancel BaleWentz, Elliott, MD 04/02/17 1517

## 2017-04-01 NOTE — H&P (Addendum)
History and Physical    Dean Weaver ZOX:[id number] DOB: 21-Dec-1955 DOA: 04/01/2017  PCP: Elvina Sidle, MD Patient coming from: Home  I have personally briefly reviewed patient's old medical records in [facility]  Chief Complaint: "belly pain"  HPI: Dean Weaver is a 62 y.o. male with medical history significant for hypertension, symptomatic sinus bradycardia, gout and left-sided Bell's palsy who presents the ED with 5 days of worsening right-sided abdominal pain associated with fevers, chills and myalgias.  Patient reports that his symptoms began acutely on Friday and worsened throughout the weekend.  He reports associated nausea and anorexia over the last 2 days.  He denies emesis.  Dull pain has localized to the right abdomen after initially being more generalized.  He notes travel to [place] approximately 1 month ago; he reports drinking filtered water and eating vegetables while there.  He denies cough, shortness of breath, rhinorrhea, diarrhea, hematochezia, melena, dysuria or hematuria.  His wife was ill last week with "flulike symptoms," distinct from his current symptoms.  He denies any prior history of similar type pain; no history of gallstones.  He presented to his cardiologist on 3/25 and reported the above symptoms.  He was given a prescription for amoxicillin, however he has not picked up that prescription from the pharmacy.  No recent medication changes.  ED Course: In the ED, patient is febrile to 101.8, heart rate 81-84, normotensive and saturating comfortably on room air.  Labs notable for leukocytosis with WBC 18.4, 87% neutrophils.  Electrolyte and renal function within normal limits.  Lipase within normal limits.  There is mild elevation in alkaline phosphatase, without transaminase elevation.  Right upper quadrant ultrasound demonstrated a contracted gallbladder with mild prominence of the gallbladder wall; sonographic Murphy sign positive.  Patient received Zosyn  and IV fluids while in the ED.  General surgery was consulted who agreed with admission to hospital medicine for parenteral antibiotics and possible operative intervention. UPDATE: CT a/p with contrast showed evidence for acute appendicitis, findings discussed with Radiology, then Dr. Daphine Deutscher (General Surgery).  Review of Systems: As per HPI otherwise 10 point review of systems negative.    Past Medical History:  Diagnosis Date  . Bell's palsy   . Gout   . Hyperlipidemia   . Hypertension     History reviewed. No pertinent surgical history.   reports that he has never smoked. He has never used smokeless tobacco. He reports that he drinks about 1.8 oz of alcohol per week. He reports that he does not use drugs.  No Known Allergies  Family History  Problem Relation Age of Onset  . Cancer Mother   . Hyperlipidemia Father   . Hypertension Father   . Hyperlipidemia Sister   . Heart disease Maternal Grandfather   . Heart attack Maternal Grandfather   . Emphysema Paternal Grandmother   . Heart disease Paternal Grandfather     Prior to Admission medications   Medication Sig Start Date End Date Taking? Authorizing Provider  allopurinol (ZYLOPRIM) 100 MG tablet Take 1 tablet (100 mg total) by mouth daily. 11/26/16  Yes Elvina Sidle, MD  FLUoxetine (PROZAC) 20 MG tablet Take 1 tablet (20 mg total) by mouth daily. 11/26/16  Yes Elvina Sidle, MD  ibuprofen (ADVIL,MOTRIN) 200 MG tablet Take 400 mg by mouth every morning.   Yes [provider]  lisinopril (PRINIVIL,ZESTRIL) 40 MG tablet Take 1 tablet (40 mg total) by mouth daily. 01/01/17  Yes Croitoru, Rachelle Hora, MD  Multiple Vitamin (  MULTIVITAMIN) tablet Take 1 tablet by mouth daily.   Yes [provider]  rosuvastatin (CRESTOR) 10 MG tablet Take 10 mg by mouth daily.   Yes [provider]  amoxicillin (AMOXIL) 875 MG tablet Take 1 tablet (875 mg total) by mouth 2 (two) times daily. Patient not taking: Reported  on 04/01/2017 04/01/17   Elvina SidleLauenstein, Kurt, MD  oxyCODONE-acetaminophen (PERCOCET/ROXICET) 5-325 MG tablet Take 1-2 tablets by mouth every 4 (four) hours as needed for severe pain. Patient not taking: Reported on 04/01/2017 04/01/17   Elvina SidleLauenstein, Kurt, MD  rosuvastatin (CRESTOR) 20 MG tablet Take 1 tablet (20 mg total) by mouth daily. Patient not taking: Reported on 04/01/2017 03/31/17   Croitoru, Rachelle HoraMihai, MD    Physical Exam: Vitals:   04/01/17 1350 04/01/17 1802 04/01/17 1930  BP: 137/80 127/80 137/74  Pulse: 81 84 74  Resp: 18 16 18   Temp: (!) 101.8 F (38.8 C) 99.1 F (37.3 C) 100.2 F (37.9 C)  TempSrc: Oral  Oral  SpO2: 99% 96% 93%  Weight: 88.5 kg (195 lb)    Height: 5' 8.5" (1.74 m)      Constitutional: NAD, calm, comfortable Eyes: PERRL, no scleral icterus, left-sided ptosis ENMT: Mucous membranes are moist. Posterior pharynx clear of any exudate or lesions. Normal dentition.  Neck: normal, supple, no masses, no thyromegaly Respiratory: clear to auscultation bilaterally, no wheezing, no crackles. Normal respiratory effort. Cardiovascular: Regular rate and rhythm, 3/6 holosystolic murmur at apex. No extremity edema. 2+ pedal pulses. Abdomen: Right upper quadrant abdominal tenderness, voluntary guarding, normoactive bowel sounds Musculoskeletal: no clubbing / cyanosis. No joint deformity upper and lower extremities. Good ROM, no contractures. Normal muscle tone.  Skin: no rashes, lesions, ulcers. No induration Neurologic: CN 2-12 grossly intact. Sensation intact, DTR normal. Strength 5/5 in all 4.  Psychiatric: Normal judgment and insight. Alert and oriented x 3. Normal mood.   Labs on Admission: I have personally reviewed following labs and imaging studies  CBC: Recent Labs  Lab 04/01/17 1419  WBC 18.4*  NEUTROABS 15.9*  HGB 13.4  HCT 38.5*  MCV 88.1  PLT 209   Basic Metabolic Panel: Recent Labs  Lab 04/01/17 1419  NA 136  K 3.7  CL 99*  CO2 25  GLUCOSE 123*    BUN 17  CREATININE 1.16  CALCIUM 8.8*   GFR: Estimated Creatinine Clearance: 72.1 mL/min (by C-G formula based on SCr of 1.16 mg/dL). Liver Function Tests: Recent Labs  Lab 04/01/17 1419  AST 34  ALT 39  ALKPHOS 137*  BILITOT 0.7  PROT 6.7  ALBUMIN 3.4*   Recent Labs  Lab 04/01/17 1419  LIPASE 26   No results for input(s): AMMONIA in the last 168 hours. Coagulation Profile: No results for input(s): INR, PROTIME in the last 168 hours. Cardiac Enzymes: No results for input(s): CKTOTAL, CKMB, CKMBINDEX, TROPONINI in the last 168 hours. BNP (last 3 results) No results for input(s): PROBNP in the last 8760 hours. HbA1C: No results for input(s): HGBA1C in the last 72 hours. CBG: No results for input(s): GLUCAP in the last 168 hours. Lipid Profile: No results for input(s): CHOL, HDL, LDLCALC, TRIG, CHOLHDL, LDLDIRECT in the last 72 hours. Thyroid Function Tests: No results for input(s): TSH, T4TOTAL, FREET4, T3FREE, THYROIDAB in the last 72 hours. Anemia Panel: No results for input(s): VITAMINB12, FOLATE, FERRITIN, TIBC, IRON, RETICCTPCT in the last 72 hours. Urine analysis:    Component Value Date/Time   BILIRUBINUR negative 09/29/2014 0856   BILIRUBINUR neg 11/01/2013  1610   KETONESUR negative 09/29/2014 0856   PROTEINUR negative 09/29/2014 0856   PROTEINUR neg 11/01/2013 0907   UROBILINOGEN 0.2 09/29/2014 0856   NITRITE Negative 09/29/2014 0856   NITRITE neg 11/01/2013 0907   LEUKOCYTESUR Negative 09/29/2014 0856    Radiological Exams on Admission: US Abdomen Limited Ruq  Result Date: 04/01/2017 CLINICAL DATA:  Right upper quadrant pain.  Fevers and chills. EXAM: ULTRASOUND ABDOMEN LIMITED RIGHT UPPER QUADRANT COMPARISON:  No prior. FINDINGS: Gallbladder: Gallbladder is contracted. Internal echoes suggesting sludge noted. Gallbladder wall thickness 3.1 mm. Positive Murphy sign. Questionable trace pericholecystic fluid. Common bile duct: Diameter: 2.5 mm Liver:  Increased echogenicity consistent fatty infiltration and/or hepatocellular disease. Slight decrease in hepatic echogenicity in the region of the porta hepatis may represent focal fatty sparing. No definite mass. Slightly lobular contour. Cirrhosis cannot be completely excluded. Portal vein is patent on color Doppler imaging with normal direction of blood flow towards the liver. IMPRESSION: 1. Contracted gallbladder. Mild prominence of the gallbladder wall at 3.1 mm. This could be from contracted state. Faint internal echoes noted within the gallbladder. Sludge may be present. Trace pericholecystic fluid noted. Positive Murphy sign. Cholecystitis cannot be completely excluded. No biliary distention. 2. Increased echogenicity of the liver consistent with fatty infiltration and/or hepatocellular disease. Slight decrease in hepatic echogenicity in the region of the porta hepatis may represent focal fatty sparing. Liver has a slightly lobular contour. Cirrhosis cannot be completely excluded. Electronically Signed   By: Maisie Fus  Register   On: 04/01/2017 11:06    Assessment/Plan Active Problems:   Cholecystitis  Sepsis 2/2 acute perforated appenditis Patient presents with fever, leukocytosis and right-sided abdominal pain. Although initial suspicion for cholecystitis, CT a/p ordered after my initial exam was inconsistent with ED findings showed evidence for perforated appendicitis.  -Continue Zosyn, pharmacy managed -General surgery consulted, awaiting plan for possible operative intervention -N.p.o. at midnight -LR at 125 cc/H -Oxycodone as needed for pain -Trend fever curve -Daily CBC, CMP  HTN -Hold lisinopril in anticipation of operative intervention  Depression -Continue Prozac  HLD -Continue Crestor  Gout -Continue allopurinol   DVT prophylaxis: Lovenox Code Status: Full Disposition Plan: Home in 1-2 days following cholecystectomy Consults called: General Surgery Admission status:  Inpatient  Marcelo Baldy MD [facility]  If 7PM-7AM, please contact night-coverage [email or web address] Password [facility]  04/01/2017, 7:57 PM

## 2017-04-01 NOTE — Telephone Encounter (Signed)
Severe RUQ pain.  Abnormal gall bladder U/S.  Surgical consult in process

## 2017-04-01 NOTE — ED Notes (Signed)
Patient transported to CT 

## 2017-04-01 NOTE — Progress Notes (Signed)
Pharmacy Antibiotic Note  Hyman Hopesaul J Peter MiniumFribush is a 62 y.o. male admitted on 04/01/2017 with intra-abdominal infection.  Pharmacy has been consulted for Zosyn dosing.  Tm 101.8 WBC 18.4 SCr 1.16  Plan: Zosyn 3.375g IV q8h (4 hour infusion).  Follow up renal fxn, culture results, and clinical course.    Height: 5' 8.5" (174 cm) Weight: 195 lb (88.5 kg) IBW/kg (Calculated) : 69.55  Temp (24hrs), Avg:100.4 F (38 C), Min:99.1 F (37.3 C), Max:101.8 F (38.8 C)  Recent Labs  Lab 04/01/17 1419  WBC 18.4*  CREATININE 1.16    Estimated Creatinine Clearance: 72.1 mL/min (by C-G formula based on SCr of 1.16 mg/dL).    No Known Allergies  Thank you for allowing pharmacy to be a part of this patient's care.  Lynann Beaverhristine Leilyn Frayre PharmD, BCPS Pager [phone] 04/01/2017 7:43 PM

## 2017-04-01 NOTE — Consult Note (Signed)
Chief Complaint:  Right sided abdominal pain since Friday (today is Tuesday)  History of Present Illness:  Dean Weaver is an 62 y.o. male who began having right sided abdominal pain and fever with chills on Friday.  He had nausea and felt bad over the weekend.  His primary care physician ordered an ultrasound today and this showed possibly some sludge.  I was called by the ER PA for a dx of acute cholecystitis but his pain and history were more concerning for possible colon or diverticular perf or appendicitis.  CT in the ER showed appendicolith and perforation.    Past Medical History:  Diagnosis Date  . Bell's palsy   . Gout   . Hyperlipidemia   . Hypertension     History reviewed. No pertinent surgical history.  Current Facility-Administered Medications  Medication Dose Route Frequency Provider Last Rate Last Dose  . acetaminophen (TYLENOL) tablet 650 mg  650 mg Oral Once PRN Bennie Pierini, MD      . allopurinol (ZYLOPRIM) tablet 100 mg  100 mg Oral Daily Bennie Pierini, MD      . enoxaparin (LOVENOX) injection 40 mg  40 mg Subcutaneous Q24H Bennie Pierini, MD      . FLUoxetine (PROZAC) capsule 20 mg  20 mg Oral Daily Bennie Pierini, MD      . Derrill Memo ON 04/02/2017] Influenza vac split quadrivalent PF (FLUARIX) injection 0.5 mL  0.5 mL Intramuscular Tomorrow-1000 Daleen Bo, MD      . lactated ringers infusion   Intravenous Continuous Bennie Pierini, MD 125 mL/hr at 04/01/17 2103    . ondansetron (ZOFRAN) tablet 4 mg  4 mg Oral Q6H PRN Bennie Pierini, MD       Or  . ondansetron [facility]) injection 4 mg  4 mg Intravenous Q6H PRN Bennie Pierini, MD      . oxyCODONE (Oxy IR/ROXICODONE) immediate release tablet 5 mg  5 mg Oral Q4H PRN Bennie Pierini, MD   5 mg at 04/01/17 2116  . [START ON 04/02/2017] piperacillin-tazobactam (ZOSYN) IVPB 3.375 g  3.375 g Intravenous Q8H Shade, Haze Justin, RPH      . rosuvastatin (CRESTOR) tablet 10 mg  10 mg Oral Daily  Bennie Pierini, MD       Patient has no known allergies. Family History  Problem Relation Age of Onset  . Cancer Mother   . Hyperlipidemia Father   . Hypertension Father   . Hyperlipidemia Sister   . Heart disease Maternal Grandfather   . Heart attack Maternal Grandfather   . Emphysema Paternal Grandmother   . Heart disease Paternal Grandfather    Social History:   reports that he has never smoked. He has never used smokeless tobacco. He reports that he drinks about 1.8 oz of alcohol per week. He reports that he does not use drugs.   REVIEW OF SYSTEMS : Negative except for hx of elevated lipids and hypertension.  He denies CV or neuro issues  Physical Exam:   Blood pressure 123/74, pulse 77, temperature 100 F (37.8 C), temperature source Oral, resp. rate 17, height 5' 8.5" (1.74 m), weight 88.5 kg (195 lb), SpO2 96 %. Body mass index is 29.22 kg/m.  Gen:  WDWN WM with pain on the right side Neurological: Alert and oriented to person, place, and time. Motor and sensory function is grossly intact  Head: Normocephalic and atraumatic.  Eyes: Conjunctivae are normal. Pupils are equal, round, and reactive  to light. No scleral icterus.  Neck: Normal range of motion. Neck supple. No tracheal deviation or thyromegaly present.  Cardiovascular:  SR without murmurs or gallops.  No carotid bruits Breast:  Not examined Respiratory: Effort normal.  No respiratory distress. No chest wall tenderness. Breath sounds normal.  No wheezes, rales or rhonchi.  Abdomen:  Tender in the right side GU:  Not examined Musculoskeletal: Normal range of motion. Extremities are nontender. No cyanosis, edema or clubbing noted Lymphadenopathy: No cervical, preauricular, postauricular or axillary adenopathy is present Skin: Skin is warm and dry. No rash noted. No diaphoresis. No erythema. No pallor. Pscyh: Normal mood and affect. Behavior is normal. Judgment and thought content normal.   LABORATORY  RESULTS: Results for orders placed or performed during the hospital encounter of 04/01/17 (from the past 48 hour(s))  Comprehensive metabolic panel     Status: Abnormal   Collection Time: 04/01/17  2:19 PM  Result Value Ref Range   Sodium 136 135 - 145 mmol/L   Potassium 3.7 3.5 - 5.1 mmol/L   Chloride 99 (L) 101 - 111 mmol/L   CO2 25 22 - 32 mmol/L   Glucose, Bld 123 (H) 65 - 99 mg/dL   BUN 17 6 - 20 mg/dL   Creatinine, Ser 1.16 0.61 - 1.24 mg/dL   Calcium 8.8 (L) 8.9 - 10.3 mg/dL   Total Protein 6.7 6.5 - 8.1 g/dL   Albumin 3.4 (L) 3.5 - 5.0 g/dL   AST 34 15 - 41 U/L   ALT 39 17 - 63 U/L   Alkaline Phosphatase 137 (H) 38 - 126 U/L   Total Bilirubin 0.7 0.3 - 1.2 mg/dL   GFR calc non Af Amer >60 >60 mL/min   GFR calc Af Amer >60 >60 mL/min    Comment: (NOTE) The eGFR has been calculated using the CKD EPI equation. This calculation has not been validated in all clinical situations. eGFR's persistently <60 mL/min signify possible Chronic Kidney Disease.    Anion gap 12 5 - 15    Comment: Performed at [facility], [place] [address], [place], [place] [address]  Lipase, blood     Status: None   Collection Time: 04/01/17  2:19 PM  Result Value Ref Range   Lipase 26 11 - 51 U/L    Comment: Performed at [facility], [place] [address], [place], [place] [address]  CBC with Diff     Status: Abnormal   Collection Time: 04/01/17  2:19 PM  Result Value Ref Range   WBC 18.4 (H) 4.0 - 10.5 K/uL   RBC 4.37 4.22 - 5.81 MIL/uL   Hemoglobin 13.4 13.0 - 17.0 g/dL   HCT 38.5 (L) 39.0 - 52.0 %   MCV 88.1 78.0 - 100.0 fL   MCH 30.7 26.0 - 34.0 pg   MCHC 34.8 30.0 - 36.0 g/dL   RDW 13.2 11.5 - 15.5 %   Platelets 209 150 - 400 K/uL   Neutrophils Relative % 87 %   Neutro Abs 15.9 (H) 1.7 - 7.7 K/uL   Lymphocytes Relative 6 %   Lymphs Abs 1.2 0.7 - 4.0 K/uL   Monocytes Relative 7 %   Monocytes Absolute 1.3 (H) 0.1 - 1.0 K/uL   Eosinophils Relative 0 %    Eosinophils Absolute 0.0 0.0 - 0.7 K/uL   Basophils Relative 0 %   Basophils Absolute 0.0 0.0 - 0.1 K/uL    Comment: Performed at [facility], [place]  [address] [place], [place] [address]  Magnesium     Status: None   Collection Time: 04/01/17  2:26 PM  Result Value Ref Range   Magnesium 2.1 1.7 - 2.4 mg/dL    Comment: Performed at [facility], [place] [address], [place], [place] [address]  Phosphorus     Status: None   Collection Time: 04/01/17  2:26 PM  Result Value Ref Range   Phosphorus 2.6 2.5 - 4.6 mg/dL    Comment: Performed at [facility], [place] [address] [place], [place] [address]  I-Stat CG4 Lactic Acid, ED     Status: None   Collection Time: 04/01/17  8:09 PM  Result Value Ref Range   Lactic Acid, Venous 0.75 0.5 - 1.9 mmol/L  Urinalysis, Routine w reflex microscopic     Status: Abnormal   Collection Time: 04/01/17  8:30 PM  Result Value Ref Range   Color, Urine YELLOW YELLOW   APPearance CLEAR CLEAR   Specific Gravity, Urine 1.013 1.005 - 1.030   pH 5.0 5.0 - 8.0   Glucose, UA NEGATIVE NEGATIVE mg/dL   Hgb urine dipstick MODERATE (A) NEGATIVE   Bilirubin Urine NEGATIVE NEGATIVE   Ketones, ur NEGATIVE NEGATIVE mg/dL   Protein, ur NEGATIVE NEGATIVE mg/dL   Nitrite NEGATIVE NEGATIVE   Leukocytes, UA NEGATIVE NEGATIVE   RBC / HPF 0-5 0 - 5 RBC/hpf   WBC, UA 0-5 0 - 5 WBC/hpf   Bacteria, UA NONE SEEN NONE SEEN   Squamous Epithelial / LPF NONE SEEN NONE SEEN    Comment: Performed at [facility], [place] [address], [place], [place] [address]     RADIOLOGY RESULTS: Ct Abdomen Pelvis W Contrast  Result Date: 04/01/2017 CLINICAL DATA:  Worsening right upper quadrant abdominal pain for several days. Fevers. Chills. Myalgias. EXAM: CT ABDOMEN AND PELVIS WITH CONTRAST TECHNIQUE: Multidetector CT imaging of the abdomen and pelvis was performed using the standard protocol following bolus administration of  intravenous contrast. CONTRAST:  121m ISOVUE-300 IOPAMIDOL (ISOVUE-300) INJECTION 61% COMPARISON:  Abdominal sonogram from earlier today. FINDINGS: Lower chest: Two tiny scattered subpleural pulmonary nodules, largest 3 mm in the right middle lobe (series 4/image 19). Hepatobiliary: Normal liver size. No liver mass. Normal gallbladder with no radiopaque cholelithiasis. No biliary ductal dilatation. Pancreas: Normal, with no mass or duct dilation. Spleen: Normal size. No mass. Adrenals/Urinary Tract: Normal adrenals. Normal kidneys with no hydronephrosis and no renal mass. Normal bladder. Stomach/Bowel: Normal non-distended stomach. There are a few small bowel loops in the right lower quadrant demonstrating wall thickening (series 2/image 63), including the terminal ileum. No significant small bowel dilatation. Prominently dilated appendix (17 mm diameter). Indistinct thickened appendiceal wall. Marked periappendiceal fat stranding and ill-defined fluid. Findings are compatible with acute appendicitis, probably perforated given the phlegmonous periappendiceal changes. There are calcified appendicoliths in the proximal appendix, largest 11 mm. Appendix: Location: Right lower quadrant Diameter: 17 mm Appendicolith: Present Mucosal hyper-enhancement: Present Extraluminal gas: Not present Periappendiceal collection: Not present There is wall thickening throughout the base of the cecum. Mild left colonic diverticulosis, without associated colonic wall thickening or pericolonic fat stranding in the left colon. Vascular/Lymphatic: Atherosclerotic nonaneurysmal abdominal aorta. Patent portal, splenic, hepatic and renal veins. No pathologically enlarged lymph nodes in the abdomen or pelvis. Reproductive: Mildly enlarged prostate. Other: No pneumoperitoneum, ascites or focal fluid collection. Musculoskeletal: No aggressive appearing focal osseous lesions. Moderate thoracolumbar spondylosis, most prominent at L5-S1.  IMPRESSION: 1. Acute appendicitis, probably perforated given the  indistinct appendiceal wall and prominent periappendiceal phlegmonous change. Appendicoliths are present. 2. No free air.  No drainable fluid collection. 3. Wall thickening within multiple right lower quadrant small bowel loops and at the base of the cecum, probably reactive. 4. Mild left colonic diverticulosis. 5. Tiny subpleural pulmonary nodules at the lung bases, largest 3 mm, probably benign. No follow-up needed if patient is low-risk (and has no known or suspected primary neoplasm). Non-contrast chest CT can be considered in 12 months if patient is high-risk. This recommendation follows the consensus statement: Guidelines for Management of Incidental Pulmonary Nodules Detected on CT Images:From the Fleischner Society 2017; published online before print (10.1148/radiol.5486282417). 6. Mild prostatomegaly. 7.  Aortic Atherosclerosis (ICD10-I70.0). These results were called by telephone at the time of interpretation on 04/01/2017 at 8:56 pm to Dr. Quita Skye Jonnie Finner , who verbally acknowledged these results. Electronically Signed   By: Ilona Sorrel M.D.   On: 04/01/2017 20:59   US Abdomen Limited Ruq  Result Date: 04/01/2017 CLINICAL DATA:  Right upper quadrant pain.  Fevers and chills. EXAM: ULTRASOUND ABDOMEN LIMITED RIGHT UPPER QUADRANT COMPARISON:  No prior. FINDINGS: Gallbladder: Gallbladder is contracted. Internal echoes suggesting sludge noted. Gallbladder wall thickness 3.1 mm. Positive Murphy sign. Questionable trace pericholecystic fluid. Common bile duct: Diameter: 2.5 mm Liver: Increased echogenicity consistent fatty infiltration and/or hepatocellular disease. Slight decrease in hepatic echogenicity in the region of the porta hepatis may represent focal fatty sparing. No definite mass. Slightly lobular contour. Cirrhosis cannot be completely excluded. Portal vein is patent on color Doppler imaging with normal direction of blood flow towards  the liver. IMPRESSION: 1. Contracted gallbladder. Mild prominence of the gallbladder wall at 3.1 mm. This could be from contracted state. Faint internal echoes noted within the gallbladder. Sludge may be present. Trace pericholecystic fluid noted. Positive Murphy sign. Cholecystitis cannot be completely excluded. No biliary distention. 2. Increased echogenicity of the liver consistent with fatty infiltration and/or hepatocellular disease. Slight decrease in hepatic echogenicity in the region of the porta hepatis may represent focal fatty sparing. Liver has a slightly lobular contour. Cirrhosis cannot be completely excluded. Electronically Signed   By: Marcello Moores  Register   On: 04/01/2017 11:06    Problem List: Patient Active Problem List   Diagnosis Date Noted  . Cholecystitis 04/01/2017  . Appendicitis 04/01/2017  . Gout   . Bell's palsy   . GERD (gastroesophageal reflux disease) 08/16/2015    Assessment & Plan: Appendicitis with probable rupture and abscess formation (18 days old) Plan IV antibiotics and rescan for potential drainage of abscess    Matt B. Hassell Done, MD, [facility] [phone] beeper [phone]  04/01/2017 11:33 PM

## 2017-04-02 DIAGNOSIS — K3532 Acute appendicitis with perforation and localized peritonitis, without abscess: Secondary | ICD-10-CM

## 2017-04-02 DIAGNOSIS — R1011 Right upper quadrant pain: Secondary | ICD-10-CM

## 2017-04-02 DIAGNOSIS — A419 Sepsis, unspecified organism: Secondary | ICD-10-CM

## 2017-04-02 DIAGNOSIS — F329 Major depressive disorder, single episode, unspecified: Secondary | ICD-10-CM

## 2017-04-02 DIAGNOSIS — I10 Essential (primary) hypertension: Secondary | ICD-10-CM

## 2017-04-02 DIAGNOSIS — K3580 Unspecified acute appendicitis: Secondary | ICD-10-CM

## 2017-04-02 DIAGNOSIS — M10019 Idiopathic gout, unspecified shoulder: Secondary | ICD-10-CM

## 2017-04-02 DIAGNOSIS — G510 Bell's palsy: Secondary | ICD-10-CM

## 2017-04-02 DIAGNOSIS — R918 Other nonspecific abnormal finding of lung field: Secondary | ICD-10-CM

## 2017-04-02 DIAGNOSIS — F32A Depression, unspecified: Secondary | ICD-10-CM

## 2017-04-02 DIAGNOSIS — E785 Hyperlipidemia, unspecified: Secondary | ICD-10-CM

## 2017-04-02 LAB — COMPREHENSIVE METABOLIC PANEL
ALT: 37 U/L (ref 17–63)
ANION GAP: 10 (ref 5–15)
AST: 33 U/L (ref 15–41)
Albumin: 3 g/dL — ABNORMAL LOW (ref 3.5–5.0)
Alkaline Phosphatase: 159 U/L — ABNORMAL HIGH (ref 38–126)
BILIRUBIN TOTAL: 0.9 mg/dL (ref 0.3–1.2)
BUN: 14 mg/dL (ref 6–20)
CO2: 26 mmol/L (ref 22–32)
Calcium: 8.6 mg/dL — ABNORMAL LOW (ref 8.9–10.3)
Chloride: 101 mmol/L (ref 101–111)
Creatinine, Ser: 1.21 mg/dL (ref 0.61–1.24)
GFR calc Af Amer: >60 mL/min (ref >60)
Glucose, Bld: 98 mg/dL (ref 65–99)
POTASSIUM: 3.8 mmol/L (ref 3.5–5.1)
Sodium: 137 mmol/L (ref 135–145)
TOTAL PROTEIN: 6.4 g/dL — AB (ref 6.5–8.1)

## 2017-04-02 LAB — CBC
HEMATOCRIT: 36 % — AB (ref 39.0–52.0)
Hemoglobin: 11.8 g/dL — ABNORMAL LOW (ref 13.0–17.0)
MCH: 29.6 pg (ref 26.0–34.0)
MCHC: 32.8 g/dL (ref 30.0–36.0)
MCV: 90.5 fL (ref 78.0–100.0)
Platelets: 205 10*3/uL (ref 150–400)
RBC: 3.98 MIL/uL — ABNORMAL LOW (ref 4.22–5.81)
RDW: 13.7 % (ref 11.5–15.5)
WBC: 16.1 10*3/uL — AB (ref 4.0–10.5)

## 2017-04-02 LAB — HIV ANTIBODY (ROUTINE TESTING W REFLEX): HIV Screen 4th Generation wRfx: NONREACTIVE

## 2017-04-02 LAB — GLUCOSE, CAPILLARY: GLUCOSE-CAPILLARY: 89 mg/dL (ref 65–99)

## 2017-04-02 MED ORDER — ACETAMINOPHEN 500 MG PO TABS
1000.0000 mg | ORAL_TABLET | Freq: Three times a day (TID) | ORAL | Status: DC
  Administered 2017-04-02 – 2017-04-06 (×12): 1000 mg via ORAL
  Filled 2017-04-02 (×12): qty 2

## 2017-04-02 MED ORDER — KCL IN DEXTROSE-NACL 20-5-0.45 MEQ/L-%-% IV SOLN
INTRAVENOUS | Status: DC
  Administered 2017-04-02: 1000 mL via INTRAVENOUS
  Administered 2017-04-02: 14:00:00 via INTRAVENOUS
  Administered 2017-04-03: 1000 mL via INTRAVENOUS
  Administered 2017-04-04: 17:00:00 via INTRAVENOUS
  Administered 2017-04-04: 1000 mL via INTRAVENOUS
  Administered 2017-04-05 (×2): via INTRAVENOUS
  Filled 2017-04-02 (×8): qty 1000

## 2017-04-02 NOTE — Progress Notes (Signed)
PROGRESS NOTE    Dean Weaver  ZOX:[id number] DOB: [birth year] DOA: 04/01/2017 PCP: Elvina Sidle, MD   Brief Narrative:  Dean Weaver is a 62 y.o. male with medical history significant for hypertension, symptomatic sinus bradycardia, gout and left-sided Bell's palsy who presents the ED with 5 days of worsening right-sided abdominal pain associated with fevers, chills and myalgias.  Patient reports that his symptoms began acutely on Friday and worsened throughout the weekend.  He reports associated nausea and anorexia over the last 2 days.  He denies emesis.  Dull pain has localized to the right abdomen after initially being more generalized.  He notes travel to [place] approximately 1 month ago; he reports drinking filtered water and eating vegetables while there. His wife was ill last week with "flulike symptoms," distinct from his current symptoms.  He denies any prior history of similar type pain; no history of gallstones.  He presented to his cardiologist on 3/25 and reported the above symptoms.  He was given a prescription for amoxicillin, however he has not picked up that prescription from the pharmacy. After Evaluation in the ED he was found to have acute appendicitis with perforation and phlegmonous changes. He was given IVF Resuscitation and General Surgery was consulted who have opted for Conservative non-operative management at this time. Patient is currently on IV Zosyn for Abx Coverage.   Assessment & Plan:   Active Problems:   Bell's palsy   Gout   Appendicitis   Lung nodules   HLD (hyperlipidemia)   Depression   Essential hypertension   Sepsis (HCC)   Acute perforated appendicitis  Sepsis 2/2 Acute Perforated Appendicitis with Phlegmon  -Patient presented with fever 101.8, leukocytosis and right-sided abdominal pain.  -Sepsis physiology improving -CT Abd/Pelvis showed Acute appendicitis, probably perforated given the indistinct appendiceal wall and prominent  periappendiceal phlegmonous change. Appendicoliths are present. There was no free air and no drainable fluid collection. There was also wall thickening within multiple RLQ small bowel loops at the base of the cecum that was probably reactive and Mild Left Colonic Diverticulosis.  -LA was 0.75 -Continue IV Zosyn with Pharmacy to Dose  -General Surgery consulted and appreciate evaluation; They are recommending continued non-surgical management given the amount of inflammation  -WBC improved to 16.1 from 18.4; Repeat CBC in AM  -NPO for now and may need TPN given possible prolonged NPO status; Will defer to General Surgery for initiation of TPN -Per General Surgery will likely need repeat CT Scan later this week to assess for formation of Abscess  -C/w IVF with D5W 1/2 NS at 125 mL/hr -Pain and Fever Control with po Acetaminophen 1000 mg po q8h -Pain control with po Oxycodone 5 mg q4hprn Moderate Pain  -Antiemetics with po/IV Zofran 4 mg q6hprn  HTN -Held Home isinopril in anticipation of operative intervention -Continue to Monitor BP's and resume Lisinopril in AM -BP now 148/66  Depression -Continue Fluoxetine 20 mg po Daily   HLD -Continue Rosuvastatin 10 mg po Daily   Gout -Continue Allopurinol 100 mg po Daily  Lung Nodules -CT Scan showed Tiny subpleural pulmonary nodules at the lung bases, largest 3 mm, probably benign -Follow up Non-Contrast CT Chest in 12 months   Hx of Bell's Palsy -No current Active issue  Normocytic Anemia -Patient's Hb/Hct went from 13.4/38.5 -> 11.8/36.0 -Continue to Monitor for S/Sx of Bleeding -Repeat CBC in AM  DVT prophylaxis:  Code Status: FULL CODE Family Communication: Friend at bedside  Disposition Plan: Remain Inpatient  for continued Evaluation and Management of Perforated Appendicitis   Consultants:   General Surgery    Procedures: None   Antimicrobials: Anti-infectives (From admission, onward)   Start     Dose/Rate Route  Frequency Ordered Stop   04/02/17 0200  piperacillin-tazobactam (ZOSYN) IVPB 3.375 g     3.375 g 12.5 mL/hr over 240 Minutes Intravenous Every 8 hours 04/01/17 1944     04/01/17 1915  piperacillin-tazobactam (ZOSYN) IVPB 3.375 g     3.375 g 100 mL/hr over 30 Minutes Intravenous  Once 04/01/17 1900 04/01/17 2010     Subjective: Seen and examined and was still having Abdominal Pain but not as bad as before. No nausea or vomiting but did have temperature yesterday. No CP or SOB.   Objective: Vitals:   04/01/17 1930 04/01/17 2051 04/02/17 0416 04/02/17 1400  BP: 137/74 123/74 127/77 (!) 148/66  Pulse: 74 77 66 62  Resp: 18 17 16 17   Temp: 100.2 F (37.9 C) 100 F (37.8 C) 98.6 F (37 C) 98.3 F (36.8 C)  TempSrc: Oral Oral Oral Oral  SpO2: 93% 96% 95% 100%  Weight:      Height:        Intake/Output Summary (Last 24 hours) at 04/02/2017 1553 Last data filed at 04/02/2017 1400 Gross per 24 hour  Intake 1168.75 ml  Output 1150 ml  Net 18.75 ml   Filed Weights   04/01/17 1350  Weight: 88.5 kg (195 lb)   Examination: Physical Exam:  Constitutional: WN/WD obese Caucasian male in NAD and appears calm and comfortable Eyes: Lids and conjunctivae normal, sclerae anicteric  ENMT: External Ears, Nose appear normal. Grossly normal hearing. Mucous membranes are moist.   Neck: Appears normal, supple, no cervical masses, normal ROM, no appreciable thyromegaly; no JVD Respiratory: Diminished to auscultation bilaterally, no wheezing, rales, rhonchi or crackles. Normal respiratory effort and patient is not tachypenic. No accessory muscle use.  Cardiovascular: RRR, no murmurs / rubs / gallops. S1 and S2 auscultated. No extremity edema.  Abdomen: Soft, non-tender, non-distended. No masses palpated. No appreciable hepatosplenomegaly. Bowel sounds positive x5. GU: Deferred. Musculoskeletal: No clubbing / cyanosis of digits/nails. No joint deformity upper and lower extremities. Good ROM, no  contractures. Skin: No rashes, lesions, ulcers on a limited skin eval. No induration; Warm and dry.  Neurologic: CN 2-12 grossly intact with no focal deficits.  Strength 5/5 in all 4. Romberg sign and cerebellar reflexes not assessed.  Psychiatric: Normal judgment and insight. Alert and oriented x 3. Normal mood and appropriate affect.   Data Reviewed: I have personally reviewed following labs and imaging studies  CBC: Recent Labs  Lab 04/01/17 1419 04/02/17 0441  WBC 18.4* 16.1*  NEUTROABS 15.9*  --   HGB 13.4 11.8*  HCT 38.5* 36.0*  MCV 88.1 90.5  PLT 209 205   Basic Metabolic Panel: Recent Labs  Lab 04/01/17 1419 04/01/17 1426 04/02/17 0441  NA 136  --  137  K 3.7  --  3.8  CL 99*  --  101  CO2 25  --  26  GLUCOSE 123*  --  98  BUN 17  --  14  CREATININE 1.16  --  1.21  CALCIUM 8.8*  --  8.6*  MG  --  2.1  --   PHOS  --  2.6  --    GFR: Estimated Creatinine Clearance: 69.1 mL/min (by C-G formula based on SCr of 1.21 mg/dL). Liver Function Tests: Recent Labs  Lab 04/01/17 1419  04/02/17 0441  AST 34 33  ALT 39 37  ALKPHOS 137* 159*  BILITOT 0.7 0.9  PROT 6.7 6.4*  ALBUMIN 3.4* 3.0*   Recent Labs  Lab 04/01/17 1419  LIPASE 26   No results for input(s): AMMONIA in the last 168 hours. Coagulation Profile: No results for input(s): INR, PROTIME in the last 168 hours. Cardiac Enzymes: No results for input(s): CKTOTAL, CKMB, CKMBINDEX, TROPONINI in the last 168 hours. BNP (last 3 results) No results for input(s): PROBNP in the last 8760 hours. HbA1C: No results for input(s): HGBA1C in the last 72 hours. CBG: Recent Labs  Lab 04/02/17 0904  GLUCAP 89   Lipid Profile: No results for input(s): CHOL, HDL, LDLCALC, TRIG, CHOLHDL, LDLDIRECT in the last 72 hours. Thyroid Function Tests: No results for input(s): TSH, T4TOTAL, FREET4, T3FREE, THYROIDAB in the last 72 hours. Anemia Panel: No results for input(s): VITAMINB12, FOLATE, FERRITIN, TIBC, IRON,  RETICCTPCT in the last 72 hours. Sepsis Labs: Recent Labs  Lab 04/01/17 2009  LATICACIDVEN 0.75    No results found for this or any previous visit (from the past 240 hour(s)).   Radiology Studies: Ct Abdomen Pelvis W Contrast  Result Date: 04/01/2017 CLINICAL DATA:  Worsening right upper quadrant abdominal pain for several days. Fevers. Chills. Myalgias. EXAM: CT ABDOMEN AND PELVIS WITH CONTRAST TECHNIQUE: Multidetector CT imaging of the abdomen and pelvis was performed using the standard protocol following bolus administration of intravenous contrast. CONTRAST:  100mL ISOVUE-300 IOPAMIDOL (ISOVUE-300) INJECTION 61% COMPARISON:  Abdominal sonogram from earlier today. FINDINGS: Lower chest: Two tiny scattered subpleural pulmonary nodules, largest 3 mm in the right middle lobe (series 4/image 19). Hepatobiliary: Normal liver size. No liver mass. Normal gallbladder with no radiopaque cholelithiasis. No biliary ductal dilatation. Pancreas: Normal, with no mass or duct dilation. Spleen: Normal size. No mass. Adrenals/Urinary Tract: Normal adrenals. Normal kidneys with no hydronephrosis and no renal mass. Normal bladder. Stomach/Bowel: Normal non-distended stomach. There are a few small bowel loops in the right lower quadrant demonstrating wall thickening (series 2/image 63), including the terminal ileum. No significant small bowel dilatation. Prominently dilated appendix (17 mm diameter). Indistinct thickened appendiceal wall. Marked periappendiceal fat stranding and ill-defined fluid. Findings are compatible with acute appendicitis, probably perforated given the phlegmonous periappendiceal changes. There are calcified appendicoliths in the proximal appendix, largest 11 mm. Appendix: Location: Right lower quadrant Diameter: 17 mm Appendicolith: Present Mucosal hyper-enhancement: Present Extraluminal gas: Not present Periappendiceal collection: Not present There is wall thickening throughout the base of the  cecum. Mild left colonic diverticulosis, without associated colonic wall thickening or pericolonic fat stranding in the left colon. Vascular/Lymphatic: Atherosclerotic nonaneurysmal abdominal aorta. Patent portal, splenic, hepatic and renal veins. No pathologically enlarged lymph nodes in the abdomen or pelvis. Reproductive: Mildly enlarged prostate. Other: No pneumoperitoneum, ascites or focal fluid collection. Musculoskeletal: No aggressive appearing focal osseous lesions. Moderate thoracolumbar spondylosis, most prominent at L5-S1. IMPRESSION: 1. Acute appendicitis, probably perforated given the indistinct appendiceal wall and prominent periappendiceal phlegmonous change. Appendicoliths are present. 2. No free air.  No drainable fluid collection. 3. Wall thickening within multiple right lower quadrant small bowel loops and at the base of the cecum, probably reactive. 4. Mild left colonic diverticulosis. 5. Tiny subpleural pulmonary nodules at the lung bases, largest 3 mm, probably benign. No follow-up needed if patient is low-risk (and has no known or suspected primary neoplasm). Non-contrast chest CT can be considered in 12 months if patient is high-risk. This recommendation follows the consensus statement:  Guidelines for Management of Incidental Pulmonary Nodules Detected on CT Images:From the Fleischner Society 2017; published online before print (10.1148/radiol.[id number]). 6. Mild prostatomegaly. 7.  Aortic Atherosclerosis (ICD10-I70.0). These results were called by telephone at the time of interpretation on 04/01/2017 at 8:56 pm to Dr. Madelaine Bhat Arlean Hopping , who verbally acknowledged these results. Electronically Signed   By: Delbert Phenix M.D.   On: 04/01/2017 20:59   US Abdomen Limited Ruq  Result Date: 04/01/2017 CLINICAL DATA:  Right upper quadrant pain.  Fevers and chills. EXAM: ULTRASOUND ABDOMEN LIMITED RIGHT UPPER QUADRANT COMPARISON:  No prior. FINDINGS: Gallbladder: Gallbladder is contracted. Internal  echoes suggesting sludge noted. Gallbladder wall thickness 3.1 mm. Positive Murphy sign. Questionable trace pericholecystic fluid. Common bile duct: Diameter: 2.5 mm Liver: Increased echogenicity consistent fatty infiltration and/or hepatocellular disease. Slight decrease in hepatic echogenicity in the region of the porta hepatis may represent focal fatty sparing. No definite mass. Slightly lobular contour. Cirrhosis cannot be completely excluded. Portal vein is patent on color Doppler imaging with normal direction of blood flow towards the liver. IMPRESSION: 1. Contracted gallbladder. Mild prominence of the gallbladder wall at 3.1 mm. This could be from contracted state. Faint internal echoes noted within the gallbladder. Sludge may be present. Trace pericholecystic fluid noted. Positive Murphy sign. Cholecystitis cannot be completely excluded. No biliary distention. 2. Increased echogenicity of the liver consistent with fatty infiltration and/or hepatocellular disease. Slight decrease in hepatic echogenicity in the region of the porta hepatis may represent focal fatty sparing. Liver has a slightly lobular contour. Cirrhosis cannot be completely excluded. Electronically Signed   By: Maisie Fus  Register   On: 04/01/2017 11:06   Scheduled Meds: . acetaminophen  1,000 mg Oral Q8H  . allopurinol  100 mg Oral Daily  . enoxaparin (LOVENOX) injection  40 mg Subcutaneous Q24H  . FLUoxetine  20 mg Oral Daily  . Influenza vac split quadrivalent PF  0.5 mL Intramuscular Tomorrow-1000  . rosuvastatin  10 mg Oral Daily   Continuous Infusions: . dextrose 5 % and 0.45 % NaCl with KCl 20 mEq/L 125 mL/hr at 04/02/17 1426  . piperacillin-tazobactam (ZOSYN)  IV 3.375 g (04/02/17 1310)    LOS: 1 day    Merlene Laughter, DO [facility] Pager [phone]  If 7PM-7AM, please contact night-coverage [email or web address] Password [facility] 04/02/2017, 3:53 PM

## 2017-04-02 NOTE — Progress Notes (Addendum)
[facility] Progress Note     Subjective: CC:  Patient reports generalized abdominal pain and flu-like symptoms beginning Friday, then localized to right lower quadrant. Currently has RLQ pain that is constant with intermittent sharp pains. Denies chills overnight. Denies nausea or vomiting. + flatus. Last BM was yesterday and was loose. At baseline has a daily formed BM. Pt states he has not eaten for 5 days.  Last documented fever was 3/16 at 1:50 PM. VSS. Objective: Vital signs in last 24 hours: Temp:  [98.6 F (37 C)-101.8 F (38.8 C)] 98.6 F (37 C) (03/27 0416) Pulse Rate:  [66-84] 66 (03/27 0416) Resp:  [16-18] 16 (03/27 0416) BP: (123-137)/(74-80) 127/77 (03/27 0416) SpO2:  [93 %-99 %] 95 % (03/27 0416) Weight:  [88.5 kg (195 lb)] 88.5 kg (195 lb) (03/26 1350) Last BM Date: 04/01/17  Intake/Output from previous day: 03/26 0701 - 03/27 0700 In: 1168.8 [I.V.:1118.8; IV Piggyback:50] Out: 750 [Urine:750] Intake/Output this shift: No intake/output data recorded.  PE: Gen:  Alert, non-toxic appearing, cooerative Card:  Regular rate and rhythm, pedal pulses 2+ BL Pulm:  Normal effort, clear to auscultation bilaterally Abd: Soft, mild distention, TTP RLQ with localized peritonitis, +BS   Skin: warm and dry, no rashes  Psych: A&Ox3   Lab Results:  Recent Labs    04/01/17 1419 04/02/17 0441  WBC 18.4* 16.1*  HGB 13.4 11.8*  HCT 38.5* 36.0*  PLT 209 205   BMET Recent Labs    04/01/17 1419 04/02/17 0441  NA 136 137  K 3.7 3.8  CL 99* 101  CO2 25 26  GLUCOSE 123* 98  BUN 17 14  CREATININE 1.16 1.21  CALCIUM 8.8* 8.6*   PT/INR No results for input(s): LABPROT, INR in the last 72 hours. CMP     Component Value Date/Time   NA 137 04/02/2017 0441   K 3.8 04/02/2017 0441   CL 101 04/02/2017 0441   CO2 26 04/02/2017 0441   GLUCOSE 98 04/02/2017 0441   BUN 14 04/02/2017 0441   CREATININE 1.21 04/02/2017 0441   CREATININE 1.26 09/29/2014 0833    CALCIUM 8.6 (L) 04/02/2017 0441   PROT 6.4 (L) 04/02/2017 0441   ALBUMIN 3.0 (L) 04/02/2017 0441   AST 33 04/02/2017 0441   ALT 37 04/02/2017 0441   ALKPHOS 159 (H) 04/02/2017 0441   BILITOT 0.9 04/02/2017 0441   GFRNONAA >60 04/02/2017 0441   GFRNONAA 62 09/29/2014 0833   GFRAA >60 04/02/2017 0441   GFRAA 72 09/29/2014 0833   Lipase     Component Value Date/Time   LIPASE 26 04/01/2017 1419       Studies/Results: Ct Abdomen Pelvis W Contrast  Result Date: 04/01/2017 CLINICAL DATA:  Worsening right upper quadrant abdominal pain for several days. Fevers. Chills. Myalgias. EXAM: CT ABDOMEN AND PELVIS WITH CONTRAST TECHNIQUE: Multidetector CT imaging of the abdomen and pelvis was performed using the standard protocol following bolus administration of intravenous contrast. CONTRAST:  ISOVUE-300 IOPAMIDOL (ISOVUE-300) INJECTION 61% COMPARISON:  Abdominal sonogram from earlier today. FINDINGS: Lower chest: Two tiny scattered subpleural pulmonary nodules, largest 3 mm in the right middle lobe (series 4/image 19). Hepatobiliary: Normal liver size. No liver mass. Normal gallbladder with no radiopaque cholelithiasis. No biliary ductal dilatation. Pancreas: Normal, with no mass or duct dilation. Spleen: Normal size. No mass. Adrenals/Urinary Tract: Normal adrenals. Normal kidneys with no hydronephrosis and no renal mass. Normal bladder. Stomach/Bowel: Normal non-distended stomach. There are a few small bowel loops in  the right lower quadrant demonstrating wall thickening (series 2/image 63), including the terminal ileum. No significant small bowel dilatation. Prominently dilated appendix (17 mm diameter). Indistinct thickened appendiceal wall. Marked periappendiceal fat stranding and ill-defined fluid. Findings are compatible with acute appendicitis, probably perforated given the phlegmonous periappendiceal changes. There are calcified appendicoliths in the proximal appendix, largest 11 mm.  Appendix: Location: Right lower quadrant Diameter: 17 mm Appendicolith: Present Mucosal hyper-enhancement: Present Extraluminal gas: Not present Periappendiceal collection: Not present There is wall thickening throughout the base of the cecum. Mild left colonic diverticulosis, without associated colonic wall thickening or pericolonic fat stranding in the left colon. Vascular/Lymphatic: Atherosclerotic nonaneurysmal abdominal aorta. Patent portal, splenic, hepatic and renal veins. No pathologically enlarged lymph nodes in the abdomen or pelvis. Reproductive: Mildly enlarged prostate. Other: No pneumoperitoneum, ascites or focal fluid collection. Musculoskeletal: No aggressive appearing focal osseous lesions. Moderate thoracolumbar spondylosis, most prominent at L5-S1. IMPRESSION: 1. Acute appendicitis, probably perforated given the indistinct appendiceal wall and prominent periappendiceal phlegmonous change. Appendicoliths are present. 2. No free air.  No drainable fluid collection. 3. Wall thickening within multiple right lower quadrant small bowel loops and at the base of the cecum, probably reactive. 4. Mild left colonic diverticulosis. 5. Tiny subpleural pulmonary nodules at the lung bases, largest 3 mm, probably benign. No follow-up needed if patient is low-risk (and has no known or suspected primary neoplasm). Non-contrast chest CT can be considered in 12 months if patient is high-risk. This recommendation follows the consensus statement: Guidelines for Management of Incidental Pulmonary Nodules Detected on CT Images:From the Fleischner Society 2017; published online before print (10.1148/[id number]). 6. Mild prostatomegaly. 7.  Aortic Atherosclerosis (ICD10-I70.0). These results were called by telephone at the time of interpretation on 04/01/2017 at 8:56 pm to Dr. Madelaine BhatADAM Arlean HoppingSCHERTZ , who verbally acknowledged these results. Electronically Signed   By: Delbert PhenixJason A Poff M.D.   On: 04/01/2017 20:59   [place] Abdomen  Limited Ruq  Result Date: 04/01/2017 CLINICAL DATA:  Right upper quadrant pain.  Fevers and chills. EXAM: ULTRASOUND ABDOMEN LIMITED RIGHT UPPER QUADRANT COMPARISON:  No prior. FINDINGS: Gallbladder: Gallbladder is contracted. Internal echoes suggesting sludge noted. Gallbladder wall thickness 3.1 mm. Positive Murphy sign. Questionable trace pericholecystic fluid. Common bile duct: Diameter: 2.5 mm Liver: Increased echogenicity consistent fatty infiltration and/or hepatocellular disease. Slight decrease in hepatic echogenicity in the region of the porta hepatis may represent focal fatty sparing. No definite mass. Slightly lobular contour. Cirrhosis cannot be completely excluded. Portal vein is patent on color Doppler imaging with normal direction of blood flow towards the liver. IMPRESSION: 1. Contracted gallbladder. Mild prominence of the gallbladder wall at 3.1 mm. This could be from contracted state. Faint internal echoes noted within the gallbladder. Sludge may be present. Trace pericholecystic fluid noted. Positive Murphy sign. Cholecystitis cannot be completely excluded. No biliary distention. 2. Increased echogenicity of the liver consistent with fatty infiltration and/or hepatocellular disease. Slight decrease in hepatic echogenicity in the region of the porta hepatis may represent focal fatty sparing. Liver has a slightly lobular contour. Cirrhosis cannot be completely excluded. Electronically Signed   By: Maisie Fushomas  Register   On: 04/01/2017 11:06    Anti-infectives: Anti-infectives (From admission, onward)   Start     Dose/Rate Route Frequency Ordered Stop   04/02/17 0200  piperacillin-tazobactam (ZOSYN) IVPB 3.375 g     3.375 g 12.5 mL/hr over 240 Minutes Intravenous Every 8 hours 04/01/17 1944     04/01/17 1915  piperacillin-tazobactam (ZOSYN) IVPB 3.375 g  3.375 g 100 mL/hr over 30 Minutes Intravenous  Once 04/01/17 1900 04/01/17 2010     Assessment/Plan HTN HLD Bells Palsy,  left Sinus bradycardia  Gout   Acute perforated appendicitis with phlegmon  - monitor vital signs  - bowel rest (NPO) - IV Zosyn - repeat BMET, CBC in AM  - will likely need abdominal CT scan later this week to assess for formation of abscess   Leukocytosis - 16,000   FEN: NPO, IVF; low threshold for TNA if going to NPO for a while, as he has been NPO for 5 days. ID: Zosyn 3/26>>  VTE: SCD's, Lovenox  Foley: none   LOS: 1 day    Adam Phenix , [facility] Surgery 04/02/2017, 9:33 AM Pager: [phone] Consults: [phone] Mon-Fri 7:00 am-4:30 pm Sat-Sun 7:00 am-11:30 am

## 2017-04-03 LAB — COMPREHENSIVE METABOLIC PANEL
ALBUMIN: 2.7 g/dL — AB (ref 3.5–5.0)
ALK PHOS: 161 U/L — AB (ref 38–126)
ALT: 42 U/L (ref 17–63)
AST: 34 U/L (ref 15–41)
Anion gap: 9 (ref 5–15)
BUN: 12 mg/dL (ref 6–20)
CALCIUM: 8.5 mg/dL — AB (ref 8.9–10.3)
CHLORIDE: 102 mmol/L (ref 101–111)
CO2: 27 mmol/L (ref 22–32)
CREATININE: 1.23 mg/dL (ref 0.61–1.24)
GFR calc non Af Amer: >60 mL/min (ref >60)
GLUCOSE: 117 mg/dL — AB (ref 65–99)
Potassium: 4.3 mmol/L (ref 3.5–5.1)
SODIUM: 138 mmol/L (ref 135–145)
Total Bilirubin: 0.7 mg/dL (ref 0.3–1.2)
Total Protein: 6 g/dL — ABNORMAL LOW (ref 6.5–8.1)

## 2017-04-03 LAB — CBC WITH DIFFERENTIAL/PLATELET
BASOS ABS: 0 10*3/uL (ref 0.0–0.1)
Basophils Relative: 0 %
EOS ABS: 0.3 10*3/uL (ref 0.0–0.7)
Eosinophils Relative: 2 %
HCT: 33.9 % — ABNORMAL LOW (ref 39.0–52.0)
HEMOGLOBIN: 11.4 g/dL — AB (ref 13.0–17.0)
LYMPHS PCT: 11 %
Lymphs Abs: 1.6 10*3/uL (ref 0.7–4.0)
MCH: 30 pg (ref 26.0–34.0)
MCHC: 33.6 g/dL (ref 30.0–36.0)
MCV: 89.2 fL (ref 78.0–100.0)
MONO ABS: 1.4 10*3/uL — AB (ref 0.1–1.0)
Monocytes Relative: 10 %
Neutro Abs: 11.1 10*3/uL — ABNORMAL HIGH (ref 1.7–7.7)
Neutrophils Relative %: 77 %
PLATELETS: 210 10*3/uL (ref 150–400)
RBC: 3.8 MIL/uL — ABNORMAL LOW (ref 4.22–5.81)
RDW: 13.8 % (ref 11.5–15.5)
WBC: 14.4 10*3/uL — ABNORMAL HIGH (ref 4.0–10.5)

## 2017-04-03 LAB — PHOSPHORUS: PHOSPHORUS: 4.2 mg/dL (ref 2.5–4.6)

## 2017-04-03 LAB — MAGNESIUM: Magnesium: 2.1 mg/dL (ref 1.7–2.4)

## 2017-04-03 LAB — GLUCOSE, CAPILLARY: GLUCOSE-CAPILLARY: 112 mg/dL — AB (ref 65–99)

## 2017-04-03 MED ORDER — LISINOPRIL 20 MG PO TABS
40.0000 mg | ORAL_TABLET | Freq: Every day | ORAL | Status: DC
  Administered 2017-04-04 – 2017-04-06 (×3): 40 mg via ORAL
  Filled 2017-04-03 (×4): qty 2

## 2017-04-03 MED ORDER — LISINOPRIL 20 MG PO TABS: 40.0000 mg | ORAL_TABLET | Freq: Every day | ORAL | Status: DC

## 2017-04-03 NOTE — Progress Notes (Signed)
Patient ID: Dean Weaver, male   DOB: Apr 26, 1955, 62 y.o.   MRN: [id number]   Acute Care Surgery Service Progress Note:    Chief Complaint/Subjective: Patient states he feels better but still has tenderness on his right side.  Positive flatus.  No nausea or vomiting.  He states that he slept well  Objective: Vital signs in last 24 hours: Temp:  [98.3 F (36.8 C)-99.3 F (37.4 C)] 99.3 F (37.4 C) (03/27 2112) Pulse Rate:  [58-62] 58 (03/27 2112) Resp:  [17-18] 18 (03/27 2112) BP: (134-148)/(66-75) 134/75 (03/27 2112) SpO2:  [95 %-100 %] 95 % (03/27 2112) Weight:  [88 kg (194 lb 0.1 oz)] 88 kg (194 lb 0.1 oz) (03/27 2112) Last BM Date: 04/01/17  Intake/Output from previous day: 03/27 0701 - 03/28 0700 In: 2650 [I.V.:2500; IV Piggyback:150] Out: 750 [Urine:750] Intake/Output this shift: Total I/O In: 120 [P.O.:120] Out: -   Lungs: cta, nonlabored  Cardiovascular: reg  Abd: Soft, subtle distention, still tender in the right lateral abdomen, no rebound or peritonitis  Extremities: no edema, +SCDs  Neuro: alert, nonfocal  Lab Results: CBC  Recent Labs    04/02/17 0441 04/03/17 0422  WBC 16.1* 14.4*  HGB 11.8* 11.4*  HCT 36.0* 33.9*  PLT 205 210   BMET Recent Labs    04/02/17 0441 04/03/17 0422  NA 137 138  K 3.8 4.3  CL 101 102  CO2 26 27  GLUCOSE 98 117*  BUN 14 12  CREATININE 1.21 1.23  CALCIUM 8.6* 8.5*   LFT Hepatic Function Latest Ref Rng & Units 04/03/2017 04/02/2017 04/01/2017  Total Protein 6.5 - 8.1 g/dL 6.0(L) 6.4(L) 6.7  Albumin 3.5 - 5.0 g/dL 2.7(L) 3.0(L) 3.4(L)  AST 15 - 41 U/L 34 33 34  ALT 17 - 63 U/L 42 37 39  Alk Phosphatase 38 - 126 U/L 161(H) 159(H) 137(H)  Total Bilirubin 0.3 - 1.2 mg/dL 0.7 0.9 0.7   PT/INR No results for input(s): LABPROT, INR in the last 72 hours. ABG No results for input(s): PHART, HCO3 in the last 72 hours.  Invalid input(s): PCO2, PO2  Studies/Results:  Anti-infectives: Anti-infectives (From  admission, onward)   Start     Dose/Rate Route Frequency Ordered Stop   04/02/17 0200  piperacillin-tazobactam (ZOSYN) IVPB 3.375 g     3.375 g 12.5 mL/hr over 240 Minutes Intravenous Every 8 hours 04/01/17 1944     04/01/17 1915  piperacillin-tazobactam (ZOSYN) IVPB 3.375 g     3.375 g 100 mL/hr over 30 Minutes Intravenous  Once 04/01/17 1900 04/01/17 2010      Medications: Scheduled Meds: . acetaminophen  1,000 mg Oral Q8H  . allopurinol  100 mg Oral Daily  . enoxaparin (LOVENOX) injection  40 mg Subcutaneous Q24H  . FLUoxetine  20 mg Oral Daily  . Influenza vac split quadrivalent PF  0.5 mL Intramuscular Tomorrow-1000  . rosuvastatin  10 mg Oral Daily   Continuous Infusions: . dextrose 5 % and 0.45 % NaCl with KCl 20 mEq/L 1,000 mL (04/03/17 0516)  . piperacillin-tazobactam (ZOSYN)  IV 3.375 g (04/03/17 1029)   PRN Meds:.ondansetron **OR** ondansetron (ZOFRAN) IV, oxyCODONE  Assessment/Plan: Patient Active Problem List   Diagnosis Date Noted  . Lung nodules 04/02/2017  . HLD (hyperlipidemia) 04/02/2017  . Depression 04/02/2017  . Essential hypertension 04/02/2017  . Sepsis ([place]) 04/02/2017  . Acute perforated appendicitis 04/02/2017  . Cholecystitis 04/01/2017  . Appendicitis 04/01/2017  . Gout   . Bell's palsy   .  GERD (gastroesophageal reflux disease) 08/16/2015   HTN HLD Bells Palsy, left Sinus bradycardia  Gout   Acute perforated appendicitis with phlegmon  He feels better.  No fever.  White count decreasing.  No tachycardia.  Abdominal exam more or less the same.  I think we can continue nonsurgical management for his ruptured appendicitis.  We will start a clear liquid diet and see how he tolerates.  I showed him his CT scan imaging at his request. - monitor vital signs  - start clears - IV Zosyn - repeat  CBC in AM  - may need abdominal CT scan later this week to if doesn't progress or has worsening symptoms/labs   Leukocytosis -  16,000-->14k  FEN: IVF, start clears ID: Zosyn 3/26>>  VTE: SCD's, Lovenox  Foley: none   Disposition:  LOS: 2 days    Leighton Ruff. Redmond Pulling, MD, FACS General, Bariatric, & Minimally Invasive Surgery [phone] [facility]

## 2017-04-03 NOTE — Progress Notes (Addendum)
PROGRESS NOTE    Dean Hopesaul J Leser  ZOX:[id number] DOB: [birth year] DOA: 04/01/2017 PCP: Elvina SidleLauenstein, Kurt, MD   Brief Narrative:  Dean Weaver is a 62 y.o. male with medical history significant for hypertension, symptomatic sinus bradycardia, gout and left-sided Bell's palsy who presents the ED with 5 days of worsening right-sided abdominal pain associated with fevers, chills and myalgias.  Patient reports that his symptoms began acutely on Friday and worsened throughout the weekend.  He reports associated nausea and anorexia over the last 2 days.  He denies emesis.  Dull pain has localized to the right abdomen after initially being more generalized.  He notes travel to [place] approximately 1 month ago; he reports drinking filtered water and eating vegetables while there. His wife was ill last week with "flulike symptoms," distinct from his current symptoms.  He denies any prior history of similar type pain; no history of gallstones.  He presented to his cardiologist on 3/25 and reported the above symptoms.  He was given a prescription for amoxicillin, however he has not picked up that prescription from the pharmacy. After Evaluation in the ED he was found to have acute appendicitis with perforation and phlegmonous changes. He was given IVF Resuscitation and General Surgery was consulted who have opted for Conservative non-operative management at this time. Patient is currently on IV Zosyn for Abx Coverage and will continue medical management as patient seems to be improving some.   Assessment & Plan:   Active Problems:   Bell's palsy   Gout   Appendicitis   Lung nodules   HLD (hyperlipidemia)   Depression   Essential hypertension   Sepsis (HCC)   Acute perforated appendicitis  Sepsis 2/2 Acute Perforated Appendicitis with Phlegmon  -Patient presented with fever 101.8, leukocytosis and right-sided abdominal pain.  -Sepsis physiology improving and WBC trending down  -CT Abd/Pelvis showed Acute  appendicitis, probably perforated given the indistinct appendiceal wall and prominent periappendiceal phlegmonous change. Appendicoliths are present. There was no free air and no drainable fluid collection. There was also wall thickening within multiple RLQ small bowel loops at the base of the cecum that was probably reactive and Mild Left Colonic Diverticulosis.  -LA was 0.75 -Continue IV Zosyn with Pharmacy to Dose  -General Surgery consulted and appreciate evaluation; They are recommending continued non-surgical management given the amount of inflammation  -WBC improved to 14.4 from 18.4; Repeat CBC in AM  -Patient's Diet advanced to Clear Liquid from General Surgery  -Per General Surgery will likely need repeat CT Scan later this week to assess for formation of Abscess  -C/w IVF with D5W 1/2 NS at 75 mL/hr -Pain and Fever Control with po Acetaminophen 1000 mg po q8h -Pain control with po Oxycodone 5 mg q4hprn Moderate Pain  -Antiemetics with po/IV Zofran 4 mg q6hprn -Pert General Surgery may need abdominal CT Scan later this week if he doesn't progress worsens   HTN -Held Home Lisinopril in anticipation of operative intervention -Continue to Monitor BP's and resume Lisinopril in AM -BP now 134/75  Depression -Continue Fluoxetine 20 mg po Daily   HLD -Continue Rosuvastatin 10 mg po Daily   Gout -Continue Allopurinol 100 mg po Daily  Lung Nodules -CT Scan showed Tiny subpleural pulmonary nodules at the lung bases, largest 3 mm, probably benign -Follow up Non-Contrast CT Chest in 12 months   Hx of Bell's Palsy -Has Left Eye facial Droop  -No current Active issue  Normocytic Anemia -Patient's Hb/Hct went from 13.4/38.5 -> 11.8/36.0 ->  11.4/33.9 -Continue to Monitor for S/Sx of Bleeding -Repeat CBC in AM  DVT prophylaxis: Enoxaparin 40 mg sq q24h Code Status: FULL CODE Family Communication: Discussed with wife at bedside  Disposition Plan: Remain Inpatient for continued  Evaluation and Management of Perforated Appendicitis   Consultants:   General Surgery    Procedures: None   Antimicrobials: Anti-infectives (From admission, onward)   Start     Dose/Rate Route Frequency Ordered Stop   04/02/17 0200  piperacillin-tazobactam (ZOSYN) IVPB 3.375 g     3.375 g 12.5 mL/hr over 240 Minutes Intravenous Every 8 hours 04/01/17 1944     04/01/17 1915  piperacillin-tazobactam (ZOSYN) IVPB 3.375 g     3.375 g 100 mL/hr over 30 Minutes Intravenous  Once 04/01/17 1900 04/01/17 2010     Subjective: Seen and examined and still was having abdominal pain but was up and walking. No CP or SOB. Still has abdominal Pain. No bowel movement but having flatus.   Objective: Vitals:   04/01/17 2051 04/02/17 0416 04/02/17 1400 04/02/17 2112  BP: 123/74 127/77 (!) 148/66 134/75  Pulse: 77 66 62 (!) 58  Resp: 17 16 17 18   Temp: 100 F (37.8 C) 98.6 F (37 C) 98.3 F (36.8 C) 99.3 F (37.4 C)  TempSrc: Oral Oral Oral Oral  SpO2: 96% 95% 100% 95%  Weight:    88 kg (194 lb 0.1 oz)  Height:        Intake/Output Summary (Last 24 hours) at 04/03/2017 1703 Last data filed at 04/03/2017 1400 Gross per 24 hour  Intake 3630 ml  Output 350 ml  Net 3280 ml   Filed Weights   04/01/17 1350 04/02/17 2112  Weight: 88.5 kg (195 lb) 88 kg (194 lb 0.1 oz)   Examination: Physical Exam:  Constitutional: WN/WD obese Caucasian male in NAD appears Calm Eyes: Sclerae anicteric. Has a droopy left eye lid ENMT: External Ears and Nose appear normal Neck: Supple with no JVD Respiratory: Diminished to auscultation; No appreciable wheezing/rales/rhonchi. Unlabored breathing Cardiovascular: RRR; No appreciable m/r/g.  Abdomen: Soft, Tender to Abdominal palpation specifically in Right, Bowel present  GU: Deferred Musculoskeletal: No contractures; No cyanosis Skin: Warm and Dry; No appreciable rashes or lesions on a limited skin eval Neurologic: CN 2-12 grossly intact. No appreciable  focal deficits  Psychiatric: Normal mood and affect. Intact judgement and insight   Data Reviewed: I have personally reviewed following labs and imaging studies  CBC: Recent Labs  Lab 04/01/17 1419 04/02/17 0441 04/03/17 0422  WBC 18.4* 16.1* 14.4*  NEUTROABS 15.9*  --  11.1*  HGB 13.4 11.8* 11.4*  HCT 38.5* 36.0* 33.9*  MCV 88.1 90.5 89.2  PLT 209 205 210   Basic Metabolic Panel: Recent Labs  Lab 04/01/17 1419 04/01/17 1426 04/02/17 0441 04/03/17 0422  NA 136  --  137 138  K 3.7  --  3.8 4.3  CL 99*  --  101 102  CO2 25  --  26 27  GLUCOSE 123*  --  98 117*  BUN 17  --  14 12  CREATININE 1.16  --  1.21 1.23  CALCIUM 8.8*  --  8.6* 8.5*  MG  --  2.1  --  2.1  PHOS  --  2.6  --  4.2   GFR: Estimated Creatinine Clearance: 67.8 mL/min (by C-G formula based on SCr of 1.23 mg/dL). Liver Function Tests: Recent Labs  Lab 04/01/17 1419 04/02/17 0441 04/03/17 0422  AST 34 33 34  ALT 39 37 42  ALKPHOS 137* 159* 161*  BILITOT 0.7 0.9 0.7  PROT 6.7 6.4* 6.0*  ALBUMIN 3.4* 3.0* 2.7*   Recent Labs  Lab 04/01/17 1419  LIPASE 26   No results for input(s): AMMONIA in the last 168 hours. Coagulation Profile: No results for input(s): INR, PROTIME in the last 168 hours. Cardiac Enzymes: No results for input(s): CKTOTAL, CKMB, CKMBINDEX, TROPONINI in the last 168 hours. BNP (last 3 results) No results for input(s): PROBNP in the last 8760 hours. HbA1C: No results for input(s): HGBA1C in the last 72 hours. CBG: Recent Labs  Lab 04/02/17 0904 04/03/17 0731  GLUCAP 89 112*   Lipid Profile: No results for input(s): CHOL, HDL, LDLCALC, TRIG, CHOLHDL, LDLDIRECT in the last 72 hours. Thyroid Function Tests: No results for input(s): TSH, T4TOTAL, FREET4, T3FREE, THYROIDAB in the last 72 hours. Anemia Panel: No results for input(s): VITAMINB12, FOLATE, FERRITIN, TIBC, IRON, RETICCTPCT in the last 72 hours. Sepsis Labs: Recent Labs  Lab 04/01/17 2009  LATICACIDVEN  0.75    No results found for this or any previous visit (from the past 240 hour(s)).   Radiology Studies: Ct Abdomen Pelvis W Contrast  Result Date: 04/01/2017 CLINICAL DATA:  Worsening right upper quadrant abdominal pain for several days. Fevers. Chills. Myalgias. EXAM: CT ABDOMEN AND PELVIS WITH CONTRAST TECHNIQUE: Multidetector CT imaging of the abdomen and pelvis was performed using the standard protocol following bolus administration of intravenous contrast. CONTRAST:  ISOVUE-300 IOPAMIDOL (ISOVUE-300) INJECTION 61% COMPARISON:  Abdominal sonogram from earlier today. FINDINGS: Lower chest: Two tiny scattered subpleural pulmonary nodules, largest 3 mm in the right middle lobe (series 4/image 19). Hepatobiliary: Normal liver size. No liver mass. Normal gallbladder with no radiopaque cholelithiasis. No biliary ductal dilatation. Pancreas: Normal, with no mass or duct dilation. Spleen: Normal size. No mass. Adrenals/Urinary Tract: Normal adrenals. Normal kidneys with no hydronephrosis and no renal mass. Normal bladder. Stomach/Bowel: Normal non-distended stomach. There are a few small bowel loops in the right lower quadrant demonstrating wall thickening (series 2/image 63), including the terminal ileum. No significant small bowel dilatation. Prominently dilated appendix (17 mm diameter). Indistinct thickened appendiceal wall. Marked periappendiceal fat stranding and ill-defined fluid. Findings are compatible with acute appendicitis, probably perforated given the phlegmonous periappendiceal changes. There are calcified appendicoliths in the proximal appendix, largest 11 mm. Appendix: Location: Right lower quadrant Diameter: 17 mm Appendicolith: Present Mucosal hyper-enhancement: Present Extraluminal gas: Not present Periappendiceal collection: Not present There is wall thickening throughout the base of the cecum. Mild left colonic diverticulosis, without associated colonic wall thickening or  pericolonic fat stranding in the left colon. Vascular/Lymphatic: Atherosclerotic nonaneurysmal abdominal aorta. Patent portal, splenic, hepatic and renal veins. No pathologically enlarged lymph nodes in the abdomen or pelvis. Reproductive: Mildly enlarged prostate. Other: No pneumoperitoneum, ascites or focal fluid collection. Musculoskeletal: No aggressive appearing focal osseous lesions. Moderate thoracolumbar spondylosis, most prominent at L5-S1. IMPRESSION: 1. Acute appendicitis, probably perforated given the indistinct appendiceal wall and prominent periappendiceal phlegmonous change. Appendicoliths are present. 2. No free air.  No drainable fluid collection. 3. Wall thickening within multiple right lower quadrant small bowel loops and at the base of the cecum, probably reactive. 4. Mild left colonic diverticulosis. 5. Tiny subpleural pulmonary nodules at the lung bases, largest 3 mm, probably benign. No follow-up needed if patient is low-risk (and has no known or suspected primary neoplasm). Non-contrast chest CT can be considered in 12 months if patient is high-risk. This recommendation follows the consensus  statement: Guidelines for Management of Incidental Pulmonary Nodules Detected on CT Images:From the Fleischner Society 2017; published online before print (10.1148/radiol.1610960454). 6. Mild prostatomegaly. 7.  Aortic Atherosclerosis (ICD10-I70.0). These results were called by telephone at the time of interpretation on 04/01/2017 at 8:56 pm to Dr. Madelaine Bhat Arlean Hopping , who verbally acknowledged these results. Electronically Signed   By: Delbert Phenix M.D.   On: 04/01/2017 20:59   Scheduled Meds: . acetaminophen  1,000 mg Oral Q8H  . allopurinol  100 mg Oral Daily  . enoxaparin (LOVENOX) injection  40 mg Subcutaneous Q24H  . FLUoxetine  20 mg Oral Daily  . Influenza vac split quadrivalent PF  0.5 mL Intramuscular Tomorrow-1000  . rosuvastatin  10 mg Oral Daily   Continuous Infusions: . dextrose 5 % and  0.45 % NaCl with KCl 20 mEq/L 1,000 mL (04/03/17 0516)  . piperacillin-tazobactam (ZOSYN)  IV Stopped (04/03/17 1429)    LOS: 2 days    Merlene Laughter, DO [facility] Pager [phone]  If 7PM-7AM, please contact night-coverage [email or web address] Password [facility] 04/03/2017, 5:03 PM

## 2017-04-04 DIAGNOSIS — R1031 Right lower quadrant pain: Secondary | ICD-10-CM

## 2017-04-04 LAB — CBC WITH DIFFERENTIAL/PLATELET
BASOS ABS: 0 10*3/uL (ref 0.0–0.1)
Basophils Relative: 0 %
EOS PCT: 2 %
Eosinophils Absolute: 0.3 10*3/uL (ref 0.0–0.7)
HCT: 33.6 % — ABNORMAL LOW (ref 39.0–52.0)
Hemoglobin: 11.2 g/dL — ABNORMAL LOW (ref 13.0–17.0)
LYMPHS ABS: 2.1 10*3/uL (ref 0.7–4.0)
Lymphocytes Relative: 15 %
MCH: 29.9 pg (ref 26.0–34.0)
MCHC: 33.3 g/dL (ref 30.0–36.0)
MCV: 89.6 fL (ref 78.0–100.0)
MONO ABS: 1.4 10*3/uL — AB (ref 0.1–1.0)
MONOS PCT: 10 %
NEUTROS PCT: 73 %
Neutro Abs: 10.4 10*3/uL — ABNORMAL HIGH (ref 1.7–7.7)
PLATELETS: 294 10*3/uL (ref 150–400)
RBC: 3.75 MIL/uL — AB (ref 4.22–5.81)
RDW: 13.8 % (ref 11.5–15.5)
WBC: 14.2 10*3/uL — AB (ref 4.0–10.5)

## 2017-04-04 LAB — COMPREHENSIVE METABOLIC PANEL
ALBUMIN: 3 g/dL — AB (ref 3.5–5.0)
ALT: 36 U/L (ref 17–63)
AST: 25 U/L (ref 15–41)
Alkaline Phosphatase: 208 U/L — ABNORMAL HIGH (ref 38–126)
Anion gap: 7 (ref 5–15)
BUN: 8 mg/dL (ref 6–20)
CO2: 29 mmol/L (ref 22–32)
CREATININE: 1.2 mg/dL (ref 0.61–1.24)
Calcium: 8.8 mg/dL — ABNORMAL LOW (ref 8.9–10.3)
Chloride: 107 mmol/L (ref 101–111)
GFR calc Af Amer: >60 mL/min (ref >60)
GFR calc non Af Amer: >60 mL/min (ref >60)
GLUCOSE: 109 mg/dL — AB (ref 65–99)
POTASSIUM: 4.2 mmol/L (ref 3.5–5.1)
Sodium: 143 mmol/L (ref 135–145)
Total Bilirubin: 0.4 mg/dL (ref 0.3–1.2)
Total Protein: 6.3 g/dL — ABNORMAL LOW (ref 6.5–8.1)

## 2017-04-04 LAB — GLUCOSE, CAPILLARY: Glucose-Capillary: 109 mg/dL — ABNORMAL HIGH (ref 65–99)

## 2017-04-04 LAB — PHOSPHORUS: Phosphorus: 4.4 mg/dL (ref 2.5–4.6)

## 2017-04-04 LAB — MAGNESIUM: MAGNESIUM: 2 mg/dL (ref 1.7–2.4)

## 2017-04-04 NOTE — Progress Notes (Signed)
Pharmacy Antibiotic Note  Dean Weaver is a 62 y.o. male admitted on 04/01/2017 with perforated appendicitis.  Pharmacy has been consulted for Zosyn dosing.  Has appendicolith, may need interval appendectomy.  Tmax 99.0 WBC 18.4 > 14.2 SCr 1.16 > 1.2  Plan: D3 abx, CL diet, renal fx stable Zosyn 3.375g IV q8h (4 hour infusion).    Height: 5' 8.5" (174 cm) Weight: 194 lb 0.1 oz (88 kg) IBW/kg (Calculated) : 69.55  Temp (24hrs), Avg:98.9 F (37.2 C), Min:98.7 F (37.1 C), Max:99 F (37.2 C)  Recent Labs  Lab 04/01/17 1419 04/01/17 2009 04/02/17 0441 04/03/17 0422 04/04/17 0408  WBC 18.4*  --  16.1* 14.4* 14.2*  CREATININE 1.16  --  1.21 1.23 1.20  LATICACIDVEN  --  0.75  --   --   --     Estimated Creatinine Clearance: 69.5 mL/min (by C-G formula based on SCr of 1.2 mg/dL).    No Known Allergies  Thank you for allowing pharmacy to be a part of this patient's care.  Otho BellowsGreen, Dean Weaver PharmD Pager [phone] 04/04/2017, 10:18 AM

## 2017-04-04 NOTE — Progress Notes (Signed)
PROGRESS NOTE    Dean Weaver  YHC:[id number] DOB: 01-23-1955 DOA: 04/01/2017 PCP: Robyn Haber, MD   Brief Narrative:  Dean Weaver is a 62 y.o. male with medical history significant for hypertension, symptomatic sinus bradycardia, gout and left-sided Bell's palsy who presents the ED with 5 days of worsening right-sided abdominal pain associated with fevers, chills and myalgias.  Patient reports that his symptoms began acutely on Friday and worsened throughout the weekend.  He reports associated nausea and anorexia over the last 2 days.  He denies emesis.  Dull pain has localized to the right abdomen after initially being more generalized.  He notes travel to [place] approximately 1 month ago; he reports drinking filtered water and eating vegetables while there. His wife was ill last week with "flulike symptoms," distinct from his current symptoms.  He denies any prior history of similar type pain; no history of gallstones.  He presented to his cardiologist on 3/25 and reported the above symptoms. He was given a prescription for amoxicillin, however he has not picked up that prescription from the pharmacy and ended up coming to the ED.  After Evaluation in the ED he was found to have acute appendicitis with perforation and phlegmonous changes. He was given IVF Resuscitation and General Surgery was consulted who have opted for Conservative non-operative management at this time. Patient is currently on IV Zosyn for Abx Coverage and will continue medical management as patient seems to be improving some and is tolerating slow advancement of his diet.   Assessment & Plan:   Active Problems:   Bell's palsy   Gout   Appendicitis   Lung nodules   HLD (hyperlipidemia)   Depression   Essential hypertension   Sepsis ([place])   Acute perforated appendicitis  Sepsis 2/2 Acute Perforated Appendicitis with Phlegmon  -Patient presented with fever 101.8, leukocytosis and right-sided abdominal pain.    -Sepsis physiology improving and WBC trending down  -CT Abd/Pelvis showed Acute appendicitis, probably perforated given the indistinct appendiceal wall and prominent periappendiceal phlegmonous change. Appendicoliths are present. There was no free air and no drainable fluid collection. There was also wall thickening within multiple RLQ small bowel loops at the base of the cecum that was probably reactive and Mild Left Colonic Diverticulosis.  -LA was 0.75 -Continue IV Zosyn with Pharmacy to Dose  -General Surgery consulted and appreciate evaluation; They are recommending continued non-surgical management given the amount of inflammation  -WBC improved to 14.2 from 18.4; Repeat CBC in AM  -Patient's Diet advanced from Clear Liquid to Full Liquid by General Surgery  -Per General Surgery will likely need repeat CT Scan later this week to assess for formation of Abscess  -C/w IVF with D5W 1/2 NS at 75 mL/hr for now -Pain and Fever Control with po Acetaminophen 1000 mg po q8h -Pain control with po Oxycodone 5 mg q4hprn Moderate Pain  -Antiemetics with po/IV Zofran 4 mg q6hprn -Apperciate General Surgery Following   HTN -Held Home Lisinopril in anticipation of operative intervention -Continue to Monitor BP's and resumed Lisinopril this AM -BP now 147/73  Depression -Continue Fluoxetine 20 mg po Daily   HLD -Continue Rosuvastatin 10 mg po Daily   Gout -Continue Allopurinol 100 mg po Daily  Lung Nodules -CT Scan showed Tiny subpleural pulmonary nodules at the lung bases, largest 3 mm, probably benign -Follow up Non-Contrast CT Chest in 12 months   Hx of Bell's Palsy -Has Left Eye facial Droop  -No current Active issue  Normocytic Anemia -Patient's Hb/Hct went from 13.4/38.5 -> 11.8/36.0 -> 11.4/33.9 -> 11.2/33.6 -Continue to Monitor for S/Sx of Bleeding -Repeat CBC in AM  Elevated Alk Phos -Trending up and went from 137 -> 159 -> 161 -> 208 -Continue to Monitor and Repeat CMP  in AM  -May consider U/S of the Abdomen   DVT prophylaxis: Enoxaparin 40 mg sq q24h Code Status: FULL CODE Family Communication: Discussed with wife at bedside  Disposition Plan: Remain Inpatient for continued Evaluation and Management of Perforated Appendicitis and Anticipate D/C Home in Next 24-48 hours if stable and cleared by General Surgery   Consultants:   General Surgery    Procedures: None   Antimicrobials: Anti-infectives (From admission, onward)   Start     Dose/Rate Route Frequency Ordered Stop   04/02/17 0200  piperacillin-tazobactam (ZOSYN) IVPB 3.375 g     3.375 g 12.5 mL/hr over 240 Minutes Intravenous Every 8 hours 04/01/17 1944     04/01/17 1915  piperacillin-tazobactam (ZOSYN) IVPB 3.375 g     3.375 g 100 mL/hr over 30 Minutes Intravenous  Once 04/01/17 1900 04/01/17 2010     Subjective: Seen and examined and was tolerating Clear Liquids. Still had RLQ Pain. No CP or SOB. Felt ok and Ambulating the halls. No lightheadedness or dizziness.   Objective: Vitals:   04/02/17 1400 04/02/17 2112 04/03/17 2141 04/04/17 0526  BP: (!) 148/66 134/75 (!) 150/80 (!) 147/73  Pulse: 62 (!) 58 (!) 55 (!) 59  Resp: _0 Temp: 98.3 F (36.8 C) 99.3 F (37.4 C) 99 F (37.2 C) 98.7 F (37.1 C)  TempSrc: Oral Oral Oral Oral  SpO2: 100% 95% 100% 99%  Weight:  88 kg (194 lb 0.1 oz)    Height:        Intake/Output Summary (Last 24 hours) at 04/04/2017 1234 Last data filed at 04/04/2017 1000 Gross per 24 hour  Intake 3438.33 ml  Output 725 ml  Net 2713.33 ml   Filed Weights   04/01/17 1350 04/02/17 2112  Weight: 88.5 kg (195 lb) 88 kg (194 lb 0.1 oz)   Examination: Physical Exam:  Constitutional: WN/WD obese Caucasian male in NAD appears calm and ambulating independently  Eyes: Sclerae anicteric. Has a droopy left eyelid from Bell's Palsy  ENMT: External Ears and nose appear normal Neck: Supple with no JVD Respiratory: Slightly diminished. No appreciable  wheezing/rales/rhonchi; Unlabored Breathing Cardiovascular: RRR; No appreciable m/r/g. No LE Edema Abdomen: Soft, Tender to Palpate especially Right LQ and side; Bowel sounds present GU: Deferred Musculoskeletal: No contractures; No cyanosis Skin: Warm and Dry; No appreciable rashes or lesions on a limited skin evaluation Neurologic: CN 2-12 grossly intact. No appreciable focal deficits Psychiatric: Normal mood and affect. Intact judgement and insight  Data Reviewed: I have personally reviewed following labs and imaging studies  CBC: Recent Labs  Lab 04/01/17 1419 04/02/17 0441 04/03/17 0422 04/04/17 0408  WBC 18.4* 16.1* 14.4* 14.2*  NEUTROABS 15.9*  --  11.1* 10.4*  HGB 13.4 11.8* 11.4* 11.2*  HCT 38.5* 36.0* 33.9* 33.6*  MCV 88.1 90.5 89.2 89.6  PLT 209 205 210 413   Basic Metabolic Panel: Recent Labs  Lab 04/01/17 1419 04/01/17 1426 04/02/17 0441 04/03/17 0422 04/04/17 0408  NA 136  --  137 138 143  K 3.7  --  3.8 4.3 4.2  CL 99*  --  101 102 107  CO2 25  --  _1 GLUCOSE 123*  --  98 117* 109*  BUN 17  --  _0 CREATININE 1.16  --  1.21 1.23 1.20  CALCIUM 8.8*  --  8.6* 8.5* 8.8*  MG  --  2.1  --  2.1 2.0  PHOS  --  2.6  --  4.2 4.4   GFR: Estimated Creatinine Clearance: 69.5 mL/min (by C-G formula based on SCr of 1.2 mg/dL). Liver Function Tests: Recent Labs  Lab 04/01/17 1419 04/02/17 0441 04/03/17 0422 04/04/17 0408  AST 34 33 34 25  ALT 39 37 42 36  ALKPHOS 137* 159* 161* 208*  BILITOT 0.7 0.9 0.7 0.4  PROT 6.7 6.4* 6.0* 6.3*  ALBUMIN 3.4* 3.0* 2.7* 3.0*   Recent Labs  Lab 04/01/17 1419  LIPASE 26   No results for input(s): AMMONIA in the last 168 hours. Coagulation Profile: No results for input(s): INR, PROTIME in the last 168 hours. Cardiac Enzymes: No results for input(s): CKTOTAL, CKMB, CKMBINDEX, TROPONINI in the last 168 hours. BNP (last 3 results) No results for input(s): PROBNP in the last 8760 hours. HbA1C: No results  for input(s): HGBA1C in the last 72 hours. CBG: Recent Labs  Lab 04/02/17 0904 04/03/17 0731 04/04/17 0742  GLUCAP 89 112* 109*   Lipid Profile: No results for input(s): CHOL, HDL, LDLCALC, TRIG, CHOLHDL, LDLDIRECT in the last 72 hours. Thyroid Function Tests: No results for input(s): TSH, T4TOTAL, FREET4, T3FREE, THYROIDAB in the last 72 hours. Anemia Panel: No results for input(s): VITAMINB12, FOLATE, FERRITIN, TIBC, IRON, RETICCTPCT in the last 72 hours. Sepsis Labs: Recent Labs  Lab 04/01/17 2009  LATICACIDVEN 0.75    No results found for this or any previous visit (from the past 240 hour(s)).   Radiology Studies: No results found. Scheduled Meds: . acetaminophen  1,000 mg Oral Q8H  . allopurinol  100 mg Oral Daily  . enoxaparin (LOVENOX) injection  40 mg Subcutaneous Q24H  . FLUoxetine  20 mg Oral Daily  . Influenza vac split quadrivalent PF  0.5 mL Intramuscular Tomorrow-1000  . lisinopril  40 mg Oral Daily  . rosuvastatin  10 mg Oral Daily   Continuous Infusions: . dextrose 5 % and 0.45 % NaCl with KCl 20 mEq/L 1,000 mL (04/04/17 0200)  . piperacillin-tazobactam (ZOSYN)  IV 3.375 g (04/04/17 1005)    LOS: 3 days    Kerney Elbe, DO [facility] Pager [phone]  If 7PM-7AM, please contact night-coverage [email or web address] Password [email or web address] 04/04/2017, 12:34 PM

## 2017-04-05 LAB — COMPREHENSIVE METABOLIC PANEL
ALBUMIN: 2.9 g/dL — AB (ref 3.5–5.0)
ALK PHOS: 237 U/L — AB (ref 38–126)
ALT: 43 U/L (ref 17–63)
AST: 35 U/L (ref 15–41)
Anion gap: 8 (ref 5–15)
BILIRUBIN TOTAL: 0.8 mg/dL (ref 0.3–1.2)
BUN: 8 mg/dL (ref 6–20)
CO2: 30 mmol/L (ref 22–32)
CREATININE: 1.32 mg/dL — AB (ref 0.61–1.24)
Calcium: 8.8 mg/dL — ABNORMAL LOW (ref 8.9–10.3)
Chloride: 104 mmol/L (ref 101–111)
GFR calc Af Amer: >60 mL/min (ref >60)
GFR calc non Af Amer: 56 mL/min — ABNORMAL LOW (ref >60)
GLUCOSE: 108 mg/dL — AB (ref 65–99)
Potassium: 4 mmol/L (ref 3.5–5.1)
SODIUM: 142 mmol/L (ref 135–145)
TOTAL PROTEIN: 6.4 g/dL — AB (ref 6.5–8.1)

## 2017-04-05 LAB — CBC WITH DIFFERENTIAL/PLATELET
BASOS ABS: 0 10*3/uL (ref 0.0–0.1)
BASOS PCT: 0 %
EOS ABS: 0.3 10*3/uL (ref 0.0–0.7)
Eosinophils Relative: 2 %
HEMATOCRIT: 33 % — AB (ref 39.0–52.0)
HEMOGLOBIN: 11.1 g/dL — AB (ref 13.0–17.0)
LYMPHS PCT: 16 %
Lymphs Abs: 2.4 10*3/uL (ref 0.7–4.0)
MCH: 30.2 pg (ref 26.0–34.0)
MCHC: 33.6 g/dL (ref 30.0–36.0)
MCV: 89.9 fL (ref 78.0–100.0)
MONOS PCT: 11 %
Monocytes Absolute: 1.6 10*3/uL — ABNORMAL HIGH (ref 0.1–1.0)
NEUTROS PCT: 71 %
Neutro Abs: 10.5 10*3/uL — ABNORMAL HIGH (ref 1.7–7.7)
Platelets: 360 10*3/uL (ref 150–400)
RBC: 3.67 MIL/uL — ABNORMAL LOW (ref 4.22–5.81)
RDW: 14 % (ref 11.5–15.5)
WBC: 14.8 10*3/uL — ABNORMAL HIGH (ref 4.0–10.5)

## 2017-04-05 LAB — GLUCOSE, CAPILLARY: Glucose-Capillary: 121 mg/dL — ABNORMAL HIGH (ref 65–99)

## 2017-04-05 LAB — MAGNESIUM: Magnesium: 2.1 mg/dL (ref 1.7–2.4)

## 2017-04-05 LAB — PHOSPHORUS: Phosphorus: 4.7 mg/dL — ABNORMAL HIGH (ref 2.5–4.6)

## 2017-04-05 NOTE — Progress Notes (Signed)
[facility] Office:  [phone] General Surgery Progress Note   LOS: 4 days  POD -     Chief Complaint: Abdominal pain  Assessment and Plan: 1.  Appendicitis   WBC - 14,300 - 04/05/2017  Zosyn  Plan:  To advance to reg diet.  If tolerated, could go home later today or tomorrow AM.  Will need a total of 14 days of antibiotics (change to augmentin).  See Dr. Andrey CampanileWilson or Dr. Daphine DeutscherMartin back in our office in about 3 weeks (maybe a week after the antibiotics are complete.  His last colonoscopy was maybe 10 years ago - so this will need to be updated - either before or after appendiceal surgery.  2.  HTN 3.  Depression 4.  Gout 5.  History of Bell's Palsy 6.  DVT prophylaxis - Lovenox 7.  Subpleural pulmonary nodule  Follow up in 12 months 8.  CKD   Creat - 1.32 - 04/05/2017   Active Problems:   Bell's palsy   Gout   Appendicitis   Lung nodules   HLD (hyperlipidemia)   Depression   Essential hypertension   Sepsis ([facility])   Acute perforated appendicitis  Subjective:  Doing better, hungry. Still sore in the RLQ, but not as tender as when he came in.  Wife in room with patient.  Objective:   Vitals:   04/04/17 1352 04/05/17 0436  BP: (!) 146/71 (!) 161/84  Pulse: (!) 56 (!) 54  Resp: 18 18  Temp: 99.2 F (37.3 C) 99.2 F (37.3 C)  SpO2: 100% 98%     Intake/Output from previous day:  03/29 0701 - 03/30 0700 In: 2391.3 [P.O.:480; I.V.:1761.3; IV Piggyback:150] Out: -   Intake/Output this shift:  No intake/output data recorded.   Physical Exam:   General: WN WM who is alert and oriented.    HEENT: Normal. Pupils equal. .   Lungs: Clear   Abdomen: Tender in the RLQ, no mass.  No peritoenal sxes.   Lab Results:    Recent Labs    04/04/17 0408 04/05/17 0432  WBC 14.2* 14.8*  HGB 11.2* 11.1*  HCT 33.6* 33.0*  PLT 294 360    BMET   Recent Labs    04/04/17 0408 04/05/17 0432  NA 143 142  K 4.2 4.0  CL 107 104  CO2 29 30  GLUCOSE 109* 108*   BUN 8 8  CREATININE 1.20 1.32*  CALCIUM 8.8* 8.8*    PT/INR  No results for input(s): LABPROT, INR in the last 72 hours.  ABG  No results for input(s): PHART, HCO3 in the last 72 hours.  Invalid input(s): PCO2, PO2   Studies/Results:  No results found.   Anti-infectives:   Anti-infectives (From admission, onward)   Start     Dose/Rate Route Frequency Ordered Stop   04/02/17 0200  piperacillin-tazobactam (ZOSYN) IVPB 3.375 g     3.375 g 12.5 mL/hr over 240 Minutes Intravenous Every 8 hours 04/01/17 1944     04/01/17 1915  piperacillin-tazobactam (ZOSYN) IVPB 3.375 g     3.375 g 100 mL/hr over 30 Minutes Intravenous  Once 04/01/17 1900 04/01/17 2010      Ovidio Kinavid Skeeter Sheard, MD, FACS Pager: [phone] [facility] Office: [phone] 04/05/2017

## 2017-04-05 NOTE — Progress Notes (Signed)
Checked frequently. Rested quietly at long intervals with eyes closed, resp even and unlabored. No acute distress noted. No verbal c/o voiced at this time. Uneventful night.

## 2017-04-05 NOTE — Progress Notes (Signed)
PROGRESS NOTE    Dean Weaver  WUJ:[id number] DOB: 1955-07-22 DOA: 04/01/2017 PCP: Robyn Haber, MD   Brief Narrative:  Dean Weaver is a 62 y.o. male with medical history significant for hypertension, symptomatic sinus bradycardia, gout and left-sided Bell's palsy who presents the ED with 5 days of worsening right-sided abdominal pain associated with fevers, chills and myalgias.  Patient reports that his symptoms began acutely on Friday and worsened throughout the weekend.  He reports associated nausea and anorexia over the last 2 days.  He denies emesis.  Dull pain has localized to the right abdomen after initially being more generalized.  He notes travel to [place] approximately 1 month ago; he reports drinking filtered water and eating vegetables while there. His wife was ill last week with "flulike symptoms," distinct from his current symptoms.  He denies any prior history of similar type pain; no history of gallstones.  He presented to his cardiologist on 3/25 and reported the above symptoms. He was given a prescription for amoxicillin, however he has not picked up that prescription from the pharmacy and ended up coming to the ED.  After Evaluation in the ED he was found to have acute appendicitis with perforation and phlegmonous changes. He was given IVF Resuscitation and General Surgery was consulted who have opted for Conservative non-operative management at this time. Patient is currently on IV Zosyn for Abx Coverage and will continue medical management as patient seems to be improving some and is tolerating slow advancement of his diet and now has been placed on Regular Diet by General Surgery. If Stable and tolerating diet possibly D/C home in AM.    Assessment & Plan:   Active Problems:   Bell's palsy   Gout   Appendicitis   Lung nodules   HLD (hyperlipidemia)   Depression   Essential hypertension   Sepsis ([place])   Acute perforated appendicitis  Sepsis 2/2 Acute Perforated  Appendicitis with Phlegmon  -Patient presented with fever 101.8, leukocytosis and right-sided abdominal pain.  -Sepsis physiology improving and WBC trending down  -CT Abd/Pelvis showed Acute appendicitis, probably perforated given the indistinct appendiceal wall and prominent periappendiceal phlegmonous change. Appendicoliths are present. There was no free air and no drainable fluid collection. There was also wall thickening within multiple RLQ small bowel loops at the base of the cecum that was probably reactive and Mild Left Colonic Diverticulosis.  -LA was 0.75 -Continue IV Zosyn with Pharmacy to Dose and change to Augmentin at D/C for total of 14 Days of Abx -General Surgery consulted and appreciate evaluation; They are recommending continued non-surgical management given the amount of inflammation  -WBC improved to 14.8 from 18.4; Repeat CBC in AM  -Patient's Diet advanced to Regular Diet by General Surgery  -Per General Surgery may need repeat CT Scan later this week to assess for formation of Abscess but will defer to them -D/C IVF with D5W 1/2 NS at 75 mL/hr for now -Pain and Fever Control with po Acetaminophen 1000 mg po q8h -Pain control with po Oxycodone 5 mg q4hprn Moderate Pain  -Antiemetics with po/IV Zofran 4 mg q6hprn -Apperciate General Surgery Following -Recommending Following up with Dr. Redmond Pulling or Dr. Hassell Done in 3 weeks and obtaining an outpatient Colonoscopy  HTN -Held Home Lisinopril in anticipation of operative intervention -Continue to Monitor BP's and resumed Lisinopril this AM -BP now 151/85  Depression -Continue Fluoxetine 20 mg po Daily   HLD -Continue Rosuvastatin 10 mg po Daily   Gout -Continue  Allopurinol 100 mg po Daily  Lung Nodules -CT Scan showed Tiny subpleural pulmonary nodules at the lung bases, largest 3 mm, probably benign -Follow up Non-Contrast CT Chest in 12 months   Hx of Bell's Palsy -Has Left Eye facial Droop  -No current Active  issue  Normocytic Anemia -Patient's Hb/Hct went from 13.4/38.5 -> 11.8/36.0 -> 11.4/33.9 -> 11.2/33.6 -> 11.1/33.0 -Continue to Monitor for S/Sx of Bleeding -Repeat CBC in AM  Elevated Alk Phos -Trending up and went from 137 -> 159 -> 161 -> 208 -> 237 -Continue to Monitor and Repeat CMP in AM  -May consider obtaining U/S of the Abdomen ]  Hyperphosphatemia -Patient's Phos Level was 4.7 -Continue to Monitor and Repeat Phos Level in AM   Mild Elevated Cr but suspect he Has CKD -Cr went from 1.2 -> 1.32 -Has been consistently around 1.2 all week -Continue to Monitor and Repeat CMP in AM    DVT prophylaxis: Enoxaparin 40 mg sq q24h Code Status: FULL CODE Family Communication: Discussed with wife at bedside  Disposition Plan: Remain Inpatient for continued Evaluation and Management of Perforated Appendicitis and Anticipate D/C Home in Next 24-48 hours if stable and cleared by General Surgery   Consultants:   General Surgery    Procedures: None   Antimicrobials: Anti-infectives (From admission, onward)   Start     Dose/Rate Route Frequency Ordered Stop   04/02/17 0200  piperacillin-tazobactam (ZOSYN) IVPB 3.375 g     3.375 g 12.5 mL/hr over 240 Minutes Intravenous Every 8 hours 04/01/17 1944     04/01/17 1915  piperacillin-tazobactam (ZOSYN) IVPB 3.375 g     3.375 g 100 mL/hr over 30 Minutes Intravenous  Once 04/01/17 1900 04/01/17 2010     Subjective: Seen and examined and states he is feeling better and abdomen isn't as painful. Also states she tolerated his diet ok and had a bowel movement. No CP or SOB. Weary about trying full diet.    Objective: Vitals:   04/04/17 1352 04/05/17 0436 04/05/17 1006 04/05/17 1400  BP: (!) 146/71 (!) 161/84 (!) 165/85 (!) 151/85  Pulse: (!) 56 (!) 54 (!) 56 (!) 54  Resp: 18 18 (!) 8 16  Temp: 99.2 F (37.3 C) 99.2 F (37.3 C) 99.4 F (37.4 C) 100.1 F (37.8 C)  TempSrc: Oral Oral Oral Oral  SpO2: 100% 98% 100% 96%  Weight:       Height:        Intake/Output Summary (Last 24 hours) at 04/05/2017 2002 Last data filed at 04/05/2017 1800 Gross per 24 hour  Intake 2080 ml  Output 0 ml  Net 2080 ml   Filed Weights   04/01/17 1350 04/02/17 2112  Weight: 88.5 kg (195 lb) 88 kg (194 lb 0.1 oz)   Examination: Physical Exam:  Constitutional: WN/WD Caucasian male in NAD appears calm and feeling improved.  Eyes: Sclerae anicteric. Has a droopy left eye lid from Bells Palsy  ENMT: External Ears and nose appear normal Neck: Supple with no JVD Respiratory: Slightly diminished; No appreciable wheezing/rales/rhonchi; Unlabored breathing Cardiovascular: RRR; No appreciable m/r/g. No LE edema Abdomen: Soft, Tender to palpate in RLQ; Bowel Sounds present  GU: Deferred Musculoskeletal: No contractures; No cyanosis Skin: Warm and Dry; No appreciable rashes or lesions on a limited skin eval Neurologic: CN 2-12 grossly intact. No appreciable focal deficits Psychiatric: Normal mood and affect. Intact judgement and insight  Data Reviewed: I have personally reviewed following labs and imaging studies  CBC: Recent Labs  Lab 04/01/17 1419 04/02/17 0441 04/03/17 0422 04/04/17 0408 04/05/17 0432  WBC 18.4* 16.1* 14.4* 14.2* 14.8*  NEUTROABS 15.9*  --  11.1* 10.4* 10.5*  HGB 13.4 11.8* 11.4* 11.2* 11.1*  HCT 38.5* 36.0* 33.9* 33.6* 33.0*  MCV 88.1 90.5 89.2 89.6 89.9  PLT 209 205 210 294 076   Basic Metabolic Panel: Recent Labs  Lab 04/01/17 1419 04/01/17 1426 04/02/17 0441 04/03/17 0422 04/04/17 0408 04/05/17 0432  NA 136  --  137 138 143 142  K 3.7  --  3.8 4.3 4.2 4.0  CL 99*  --  101 102 107 104  CO2 25  --  _0 GLUCOSE 123*  --  98 117* 109* 108*  BUN 17  --  _1 CREATININE 1.16  --  1.21 1.23 1.20 1.32*  CALCIUM 8.8*  --  8.6* 8.5* 8.8* 8.8*  MG  --  2.1  --  2.1 2.0 2.1  PHOS  --  2.6  --  4.2 4.4 4.7*   GFR: Estimated Creatinine Clearance: 63.2 mL/min (A) (by C-G formula based on  SCr of 1.32 mg/dL (H)). Liver Function Tests: Recent Labs  Lab 04/01/17 1419 04/02/17 0441 04/03/17 0422 04/04/17 0408 04/05/17 0432  AST 34 33 34 25 35  ALT 39 37 42 36 43  ALKPHOS 137* 159* 161* 208* 237*  BILITOT 0.7 0.9 0.7 0.4 0.8  PROT 6.7 6.4* 6.0* 6.3* 6.4*  ALBUMIN 3.4* 3.0* 2.7* 3.0* 2.9*   Recent Labs  Lab 04/01/17 1419  LIPASE 26   No results for input(s): AMMONIA in the last 168 hours. Coagulation Profile: No results for input(s): INR, PROTIME in the last 168 hours. Cardiac Enzymes: No results for input(s): CKTOTAL, CKMB, CKMBINDEX, TROPONINI in the last 168 hours. BNP (last 3 results) No results for input(s): PROBNP in the last 8760 hours. HbA1C: No results for input(s): HGBA1C in the last 72 hours. CBG: Recent Labs  Lab 04/02/17 0904 04/03/17 0731 04/04/17 0742 04/05/17 0733  GLUCAP 89 112* 109* 121*   Lipid Profile: No results for input(s): CHOL, HDL, LDLCALC, TRIG, CHOLHDL, LDLDIRECT in the last 72 hours. Thyroid Function Tests: No results for input(s): TSH, T4TOTAL, FREET4, T3FREE, THYROIDAB in the last 72 hours. Anemia Panel: No results for input(s): VITAMINB12, FOLATE, FERRITIN, TIBC, IRON, RETICCTPCT in the last 72 hours. Sepsis Labs: Recent Labs  Lab 04/01/17 2009  LATICACIDVEN 0.75    No results found for this or any previous visit (from the past 240 hour(s)).   Radiology Studies: No results found. Scheduled Meds: . acetaminophen  1,000 mg Oral Q8H  . allopurinol  100 mg Oral Daily  . enoxaparin (LOVENOX) injection  40 mg Subcutaneous Q24H  . FLUoxetine  20 mg Oral Daily  . Influenza vac split quadrivalent PF  0.5 mL Intramuscular Tomorrow-1000  . lisinopril  40 mg Oral Daily  . rosuvastatin  10 mg Oral Daily   Continuous Infusions: . dextrose 5 % and 0.45 % NaCl with KCl 20 mEq/L 75 mL/hr at 04/05/17 1926  . piperacillin-tazobactam (ZOSYN)  IV 3.375 g (04/05/17 1700)    LOS: 4 days    Kerney Elbe, DO [facility] Pager [phone]  If 7PM-7AM, please contact night-coverage [email or web address] Password [email or web address] 04/05/2017, 8:02 PM

## 2017-04-06 LAB — CBC WITH DIFFERENTIAL/PLATELET
BASOS ABS: 0 10*3/uL (ref 0.0–0.1)
Basophils Relative: 0 %
EOS ABS: 0.2 10*3/uL (ref 0.0–0.7)
Eosinophils Relative: 1 %
HCT: 35.9 % — ABNORMAL LOW (ref 39.0–52.0)
HEMOGLOBIN: 12.3 g/dL — AB (ref 13.0–17.0)
LYMPHS PCT: 14 %
Lymphs Abs: 2.3 10*3/uL (ref 0.7–4.0)
MCH: 30.8 pg (ref 26.0–34.0)
MCHC: 34.3 g/dL (ref 30.0–36.0)
MCV: 90 fL (ref 78.0–100.0)
MONO ABS: 1.3 10*3/uL — AB (ref 0.1–1.0)
Monocytes Relative: 8 %
NEUTROS ABS: 12.7 10*3/uL — AB (ref 1.7–7.7)
NEUTROS PCT: 77 %
PLATELETS: 415 10*3/uL — AB (ref 150–400)
RBC: 3.99 MIL/uL — ABNORMAL LOW (ref 4.22–5.81)
RDW: 13.9 % (ref 11.5–15.5)
WBC: 16.5 10*3/uL — ABNORMAL HIGH (ref 4.0–10.5)

## 2017-04-06 LAB — COMPREHENSIVE METABOLIC PANEL
ALBUMIN: 2.8 g/dL — AB (ref 3.5–5.0)
ALK PHOS: 235 U/L — AB (ref 38–126)
ALT: 70 U/L — ABNORMAL HIGH (ref 17–63)
ANION GAP: 11 (ref 5–15)
AST: 61 U/L — ABNORMAL HIGH (ref 15–41)
BUN: 9 mg/dL (ref 6–20)
CALCIUM: 9 mg/dL (ref 8.9–10.3)
CO2: 25 mmol/L (ref 22–32)
Chloride: 104 mmol/L (ref 101–111)
Creatinine, Ser: 1.24 mg/dL (ref 0.61–1.24)
GFR calc Af Amer: >60 mL/min (ref >60)
GFR calc non Af Amer: >60 mL/min (ref >60)
GLUCOSE: 102 mg/dL — AB (ref 65–99)
POTASSIUM: 4 mmol/L (ref 3.5–5.1)
SODIUM: 140 mmol/L (ref 135–145)
Total Bilirubin: 0.5 mg/dL (ref 0.3–1.2)
Total Protein: 6.7 g/dL (ref 6.5–8.1)

## 2017-04-06 LAB — PHOSPHORUS: PHOSPHORUS: 4 mg/dL (ref 2.5–4.6)

## 2017-04-06 LAB — MAGNESIUM: Magnesium: 2.6 mg/dL — ABNORMAL HIGH (ref 1.7–2.4)

## 2017-04-06 MED ORDER — IOPAMIDOL (ISOVUE-300) INJECTION 61%
INTRAVENOUS | Status: AC
  Filled 2017-04-06: qty 30

## 2017-04-06 MED ORDER — AMOXICILLIN-POT CLAVULANATE 875-125 MG PO TABS: 1.0000 | ORAL_TABLET | Freq: Two times a day (BID) | ORAL | Status: DC

## 2017-04-06 MED ORDER — OXYCODONE-ACETAMINOPHEN 5-325 MG PO TABS
1.0000 | ORAL_TABLET | ORAL | Status: DC | PRN
  Administered 2017-04-06: 1 via ORAL
  Filled 2017-04-06: qty 1

## 2017-04-06 MED ORDER — AMOXICILLIN-POT CLAVULANATE 875-125 MG PO TABS: 1.0000 | ORAL_TABLET | Freq: Two times a day (BID) | ORAL | 0 refills | Status: AC

## 2017-04-06 MED ORDER — ONDANSETRON HCL 4 MG PO TABS: 4.0000 mg | ORAL_TABLET | Freq: Four times a day (QID) | ORAL | 0 refills | Status: DC | PRN

## 2017-04-06 MED ORDER — OXYCODONE-ACETAMINOPHEN 5-325 MG PO TABS: 1.0000 | ORAL_TABLET | Freq: Four times a day (QID) | ORAL | 0 refills | Status: DC | PRN

## 2017-04-06 MED ORDER — IOPAMIDOL (ISOVUE-300) INJECTION 61%: 15.0000 mL | Freq: Once | INTRAVENOUS | Status: DC | PRN

## 2017-04-06 MED ORDER — IBUPROFEN 200 MG PO TABS
400.0000 mg | ORAL_TABLET | Freq: Every day | ORAL | Status: DC
  Administered 2017-04-06: 400 mg via ORAL
  Filled 2017-04-06: qty 2

## 2017-04-06 NOTE — Progress Notes (Signed)
Pharmacy Antibiotic Note  Hyman Hopesaul J Peter MiniumFribush is a 62 y.o. male admitted on 04/01/2017 with perforated appendicitis.  Pharmacy has been consulted for Zosyn dosing.  Has appendicolith, may need interval appendectomy.  Today, 04/06/2017 Day #5 Zosyn Tmax 99.6 WBC 14.8 > 16.5 SCr 1.32 > 1.24  Plan: Continue Zosyn 3.375g IV q8h (4 hour infusion).  No dose adjustments needed, Pharmacy will sign off Plan noted to change to Augmentin for a total of 14 days antibiotics   Height: 5' 8.5" (174 cm) Weight: 194 lb 0.1 oz (88 kg) IBW/kg (Calculated) : 69.55  Temp (24hrs), Avg:99.3 F (37.4 C), Min:98 F (36.7 C), Max:100.1 F (37.8 C)  Recent Labs  Lab 04/01/17 2009 04/02/17 0441 04/03/17 0422 04/04/17 0408 04/05/17 0432 04/06/17 0536  WBC  --  16.1* 14.4* 14.2* 14.8* 16.5*  CREATININE  --  1.21 1.23 1.20 1.32* 1.24  LATICACIDVEN 0.75  --   --   --   --   --     Estimated Creatinine Clearance: 67.3 mL/min (by C-G formula based on SCr of 1.24 mg/dL).    No Known Allergies  Thank you for allowing pharmacy to be a part of this patient's care.  Loralee PacasErin Maebelle Sulton, PharmD, BCPS Pager: [phone] 04/06/2017, 10:01 AM

## 2017-04-06 NOTE — Progress Notes (Signed)
Discharge instructions reviewed with patient. All questions answered. Patient ambulated to vehicle with belongings by nurse tech 

## 2017-04-06 NOTE — Discharge Summary (Addendum)
Physician Discharge Summary  Dean Weaver XNT:[id number] DOB: [birth year] DOA: 04/01/2017  PCP: Robyn Haber, MD  Admit date: 04/01/2017 Discharge date: 04/06/2017  Admitted From: Home Disposition:  Home  Recommendations for Outpatient Follow-up:  1. Follow up with PCP in 1-2 weeks 2. Follow up with General Surgery in 1-3 weeks 3. Follow up CT Scan in 12 months of Lung Nodules 4. Consider obtaining U/S of the Abdomen if LFT's and Alk Phos still elevated 5. Defer to General Surgery about repeating CT Scan of the Abdomen and Pelvis as an outpatient  6. Follow up with [facility] for outpatient Colonoscopy  7. Please obtain CMP/CBC, Mag, Phos in one week 8. Please follow up on the following pending results:  Home Health: No Equipment/Devices: None   Discharge Condition: Stable CODE STATUS: FULL CODE Diet recommendation: Heart Healthy Diet   Brief/Interim Summary: Dalin Caldera Fribushis a 62 y.o.malewith medical history significantforhypertension, symptomatic sinus bradycardia, gout and left-sided Bell's palsy who presents the ED with 5 days of worsening right-sidedabdominal pain associated with fevers, chills and myalgias. Patient reports that his symptoms began acutely on Friday and worsened throughout the weekend. He reports associated nausea and anorexia over the last 2 days. He denies emesis. Dull pain has localized to the rightabdomenafter initially being more generalized. He notes travel to [place] approximately 1 month ago; he reports drinking filtered water and eating vegetables while there.His wife was ill last week with "flulike symptoms,"distinct from his current symptoms. He denies any prior history of similar type pain; no history of gallstones. He presented to his cardiologist on 3/25 and reported the above symptoms. He was given a prescription for amoxicillin, however he has not picked up that prescription from the pharmacy and ended up coming to the  ED.  After Evaluation in the ED he was found to have acute appendicitis with perforation and phlegmonous changes. He was given IVF Resuscitation and General Surgery was consulted who have opted for Conservative non-operative management at this time. Patient is currently on IV Zosyn for Abx Coverage and will continue medical management as patient seems to be improving some and is tolerating slow advancement of his diet and now has been placed on Regular Diet by General Surgery. Patient has tolerated diet well and has been having bowel movements. Still has some pain but it is manageable. Patient's WBC started elevating again and a CT Abdomen and Pelvis was ordered but discontinued by Dr. Lucia Gaskins in General Surgery who felt patient was doing well and could go home. Will defer to them to obtain as an outpatient at follow up. Patient has improved and was deemed medically stable to D/C Home and follow up with General Surgery in 1-3 weeks. He wants to establish care with [facility] so he has been given their numbers to call. When he does follow up with PCP he will need to have his CMP and CBC repeated given his slight elevation in LFT's and Alk Phos. Patient overall feels well and will also need outpatient Colonoscopy and likely interval Appendectomy.   Discharge Diagnoses:  Active Problems:   Bell's palsy   Gout   Appendicitis   Lung nodules   HLD (hyperlipidemia)   Depression   Essential hypertension   Sepsis ([place])   Acute perforated appendicitis  Sepsis 2/2Acute Perforated Appendicitis with Phlegmon, improved -Patient presented with fever 101.8,leukocytosis andright-sided abdominal pain. -Sesis physiology improved; Patient improved clincally  -CT Abd/Pelvis showed Acute appendicitis, probably perforated given the indistinct appendiceal wall and prominent periappendiceal  phlegmonous change. Appendicoliths are present. There was no free air and no drainable fluid collection. There was also  wall thickening within multiple RLQ small bowel loops at the base of the cecum that was probably reactive and Mild Left Colonic Diverticulosis.  -LA was 0.75 -Given IV Zosyn with Pharmacy to Dose and changed to Augmentin at D/C for total of 14 Days of Abx -General Surgery consulted and appreciate evaluation; They are recommending continued non-surgical management given the amount of inflammation  -WBC improved to 14.2 from 18.4 but slowly trending up and now 16.5; Repeat CBC as an outpatient as patient improved clinically and has been afebrile -Patient's Diet advanced to Regular Diet by General Surgery and he has tolerated that well and appears improved clinically -Per General Surgery may need repeat CT Scan later this week to assess for formation of Abscess but will defer to them; Ordered a CT Abd/Pelvis this AM because of elevated WBC but discontinued by Dr. Lucia Gaskins in [place] as patient appeared well clinically and felt well. Defer to them to obtain outpatient CT Abd/Pelvis at Discharge Follow up Appointment  -D/C'd IVF with D5W 1/2 NS at 75 mL/hr for now -Pain and Fever Control with po Acetaminophen 1000 mg po q8h -Pain control with po Oxycodone-Acetaminophen 5/325 mg q6hprn Moderate Pain  -Antiemetics with po/IV Zofran 4 mg q6hprn while hospitalized -Clinically patient has improved tremendously and is having bowel moments. WBC trending up but patient has been afebrile -Apperciated General Surgery Following -Recommending Discharge Home and Following up with Dr. Redmond Pulling or Dr. Hassell Done in 3 weeks and obtaining an outpatient Colonoscopy; Patient would like to switch to [facility] for Care; Will need to call to schedule appointments   HTN -Held Home Lisinopril in anticipation of operative intervention but was then resumed as he did not under go procedure -BP was 169/71 this AM -Continue to Monitor BP and further titration and medication adjustments by PCP   Depression -Continue  Fluoxetine 20 mg po Daily   HLD -Continue Rosuvastatin 10 mg po Daily   Gout -Continue Allopurinol 100 mg po Daily -Resume Home Ibuprofen 400 mg po Daily for pain  Lung Nodules -CT Scan showed Tiny subpleural pulmonary nodules at the lung bases, largest 3 mm, probably benign -Follow up Non-Contrast CT Chest in 12 months   Hx of Bell's Palsy -Has Left Eye facial Droop  -No current Active issue  Normocytic Anemia -Patient's Hb/Hct stable at 12.3/35.9 -Continue to Monitor for S/Sx of Bleeding -Repeat CBC as an outpatient   Elevated Alk Phos -U/S Abdomen RUQ showed Contracted gallbladder. Mild prominence of the gallbladder wall at 3.1 mm. This could be from contracted state. Faint internal echoes noted within the gallbladder. Sludge may be present. Trace pericholecystic fluid noted. Positive Murphy sign. Cholecystitis cannot be completely excluded. No biliary distention. There was Increased echogenicity of the liver consistent with fatty infiltration and/or hepatocellular disease. Slight decrease in hepatic echogenicity in the region of the porta hepatis may represent focal fatty sparing. Liver has a slightly lobular contour. Cirrhosis cannot be completely excluded. -CT Scan however showed Normal liver size. No liver mass. Normal gallbladder with no radiopaque cholelithiasis. No biliary ductal dilatation and patient had a perforated Appendicitis with prominent periappendiceal phlegmonous change. Appendicoliths are present. -Trending up and went from 137 -> 159 -> 161 -> 208 -> 237 -> 235 -Continue to Monitor and Repeat CMP as an outpatient -May consider repeat U/S of the Abdomen as an outpatient if still elevated   Hyperphosphatemia -  Patient's Phos Level was 4.7 and improved to 4.0 -Continue to Monitor and Repeat Phos Level in AM  Abnormal LFT's/Transaminitis -Mild Elevation in AST at 61 and ALT at 70 -Likely Reactive; Discontinue Scheduled Tylenol and restarted Home Ibuprofen  and Oxycodone-Acetaminophen -Repeat CMP as an outpatient and if still elevated obtain ana repeat U/S of the Abdomen as an outpatient   Mild Elevated Cr but suspect he Has CKD -Cr went from 1.2 -> 1.32 but now improved to 1.24 -Has been consistently around 1.2 all week -Continue to Monitor and Repeat CMP as an outpatient   Discharge Instructions  Allergies as of 04/06/2017   No Known Allergies     Medication List    STOP taking these medications   amoxicillin 875 MG tablet Commonly known as:  AMOXIL     TAKE these medications   allopurinol 100 MG tablet Commonly known as:  ZYLOPRIM Take 1 tablet (100 mg total) by mouth daily.   amoxicillin-clavulanate 875-125 MG tablet Commonly known as:  AUGMENTIN Take 1 tablet by mouth every 12 (twelve) hours for 10 days.   FLUoxetine 20 MG tablet Commonly known as:  PROZAC Take 1 tablet (20 mg total) by mouth daily.   ibuprofen 200 MG tablet Commonly known as:  ADVIL,MOTRIN Take 400 mg by mouth every morning.   lisinopril 40 MG tablet Commonly known as:  PRINIVIL,ZESTRIL Take 1 tablet (40 mg total) by mouth daily.   multivitamin tablet Take 1 tablet by mouth daily.   ondansetron 4 MG tablet Commonly known as:  ZOFRAN Take 1 tablet (4 mg total) by mouth every 6 (six) hours as needed for nausea.   oxyCODONE-acetaminophen 5-325 MG tablet Commonly known as:  PERCOCET/ROXICET Take 1-2 tablets by mouth every 6 (six) hours as needed for severe pain. What changed:  when to take this   rosuvastatin 10 MG tablet Commonly known as:  CRESTOR Take 10 mg by mouth daily. What changed:  Another medication with the same name was removed. Continue taking this medication, and follow the directions you see here.      Follow-up Information    Croitoru, Mihai, MD .   Specialty:  Cardiology Contact information: [address] [place] [address] [phone]        Josetta Huddle, MD. Call.   Specialty:  Internal  Medicine Why:  Call to schedule a Follow up and Establish as a patient.  Contact information: [address] [place] [address] [phone]        Laurence Spates, MD. Call.   Specialty:  Gastroenterology Why:  Follow up with Dr. Oletta Lamas for outpatient Colonoscopy  Contact information: [address] [place] [address] [phone]        Greer Pickerel, MD. Call.   Specialty:  General Surgery Why:  Follow up within 1-3 weeks of Discharge Contact information: [place] [address] [phone]          No Known Allergies  Consultations:  General Surgery  Procedures/Studies: Ct Abdomen Pelvis W Contrast  Result Date: 04/01/2017 CLINICAL DATA:  Worsening right upper quadrant abdominal pain for several days. Fevers. Chills. Myalgias. EXAM: CT ABDOMEN AND PELVIS WITH CONTRAST TECHNIQUE: Multidetector CT imaging of the abdomen and pelvis was performed using the standard protocol following bolus administration of intravenous contrast. CONTRAST:  149m ISOVUE-300 IOPAMIDOL (ISOVUE-300) INJECTION 61% COMPARISON:  Abdominal sonogram from earlier today. FINDINGS: Lower chest: Two tiny scattered subpleural pulmonary nodules, largest  3 mm in the right middle lobe (series 4/image 19). Hepatobiliary: Normal liver size. No liver mass. Normal gallbladder with no radiopaque cholelithiasis. No biliary ductal dilatation. Pancreas: Normal, with no mass or duct dilation. Spleen: Normal size. No mass. Adrenals/Urinary Tract: Normal adrenals. Normal kidneys with no hydronephrosis and no renal mass. Normal bladder. Stomach/Bowel: Normal non-distended stomach. There are a few small bowel loops in the right lower quadrant demonstrating wall thickening (series 2/image 63), including the terminal ileum. No significant small bowel dilatation. Prominently dilated appendix (17 mm diameter). Indistinct thickened appendiceal wall. Marked  periappendiceal fat stranding and ill-defined fluid. Findings are compatible with acute appendicitis, probably perforated given the phlegmonous periappendiceal changes. There are calcified appendicoliths in the proximal appendix, largest 11 mm. Appendix: Location: Right lower quadrant Diameter: 17 mm Appendicolith: Present Mucosal hyper-enhancement: Present Extraluminal gas: Not present Periappendiceal collection: Not present There is wall thickening throughout the base of the cecum. Mild left colonic diverticulosis, without associated colonic wall thickening or pericolonic fat stranding in the left colon. Vascular/Lymphatic: Atherosclerotic nonaneurysmal abdominal aorta. Patent portal, splenic, hepatic and renal veins. No pathologically enlarged lymph nodes in the abdomen or pelvis. Reproductive: Mildly enlarged prostate. Other: No pneumoperitoneum, ascites or focal fluid collection. Musculoskeletal: No aggressive appearing focal osseous lesions. Moderate thoracolumbar spondylosis, most prominent at L5-S1. IMPRESSION: 1. Acute appendicitis, probably perforated given the indistinct appendiceal wall and prominent periappendiceal phlegmonous change. Appendicoliths are present. 2. No free air.  No drainable fluid collection. 3. Wall thickening within multiple right lower quadrant small bowel loops and at the base of the cecum, probably reactive. 4. Mild left colonic diverticulosis. 5. Tiny subpleural pulmonary nodules at the lung bases, largest 3 mm, probably benign. No follow-up needed if patient is low-risk (and has no known or suspected primary neoplasm). Non-contrast chest CT can be considered in 12 months if patient is high-risk. This recommendation follows the consensus statement: Guidelines for Management of Incidental Pulmonary Nodules Detected on CT Images:From the Fleischner Society 2017; published online before print (10.1148/radiol.0160109323). 6. Mild prostatomegaly. 7.  Aortic Atherosclerosis  (ICD10-I70.0). These results were called by telephone at the time of interpretation on 04/01/2017 at 8:56 pm to Dr. Quita Skye Jonnie Finner , who verbally acknowledged these results. Electronically Signed   By: Ilona Sorrel M.D.   On: 04/01/2017 20:59   US Abdomen Limited Ruq  Result Date: 04/01/2017 CLINICAL DATA:  Right upper quadrant pain.  Fevers and chills. EXAM: ULTRASOUND ABDOMEN LIMITED RIGHT UPPER QUADRANT COMPARISON:  No prior. FINDINGS: Gallbladder: Gallbladder is contracted. Internal echoes suggesting sludge noted. Gallbladder wall thickness 3.1 mm. Positive Murphy sign. Questionable trace pericholecystic fluid. Common bile duct: Diameter: 2.5 mm Liver: Increased echogenicity consistent fatty infiltration and/or hepatocellular disease. Slight decrease in hepatic echogenicity in the region of the porta hepatis may represent focal fatty sparing. No definite mass. Slightly lobular contour. Cirrhosis cannot be completely excluded. Portal vein is patent on color Doppler imaging with normal direction of blood flow towards the liver. IMPRESSION: 1. Contracted gallbladder. Mild prominence of the gallbladder wall at 3.1 mm. This could be from contracted state. Faint internal echoes noted within the gallbladder. Sludge may be present. Trace pericholecystic fluid noted. Positive Murphy sign. Cholecystitis cannot be completely excluded. No biliary distention. 2. Increased echogenicity of the liver consistent with fatty infiltration and/or hepatocellular disease. Slight decrease in hepatic echogenicity in the region of the porta hepatis may represent focal fatty sparing. Liver has a slightly lobular contour. Cirrhosis cannot be completely excluded. Electronically Signed   By: Marcello Moores  Register   On: 04/01/2017 11:06    Subjective: Seen and examined at bedside and was doing better. No CP or SOB. Still had some RLQ Tenderness but not as severe. Tolerating diet and having bowel movements. No other complaints or concerns and  ready to go home.  Discharge Exam: Vitals:   04/06/17 0624 04/06/17 0944  BP: (!) 153/86 (!) 169/71  Pulse: (!) 52   Resp: 18   Temp: 98 F (36.7 C)   SpO2: 97%    Vitals:   04/05/17 1400 04/05/17 2043 04/06/17 0624 04/06/17 0944  BP: (!) 151/85 (!) 147/81 (!) 153/86 (!) 169/71  Pulse: (!) 54 (!) 52 (!) 52   Resp: '16 17 18   '$ Temp: 100.1 F (37.8 C) 99.6 F (37.6 C) 98 F (36.7 C)   TempSrc: Oral Oral Oral   SpO2: 96% 96% 97%   Weight:      Height:       General: Pt is alert, awake, not in acute distress; Has slight Left eye droop slightly  Cardiovascular: RRR, S1/S2 +, no rubs, no gallops Respiratory: Diminished bilaterally, no wheezing, no rhonchi Abdominal: Soft, Tender to palpate RLQ, ND, bowel sounds + Extremities: no edema, no cyanosis  The results of significant diagnostics from this hospitalization (including imaging, microbiology, ancillary and laboratory) are listed below for reference.    Microbiology: No results found for this or any previous visit (from the past 240 hour(s)).   Labs: BNP (last 3 results) No results for input(s): BNP in the last 8760 hours. Basic Metabolic Panel: Recent Labs  Lab 04/01/17 1426 04/02/17 0441 04/03/17 0422 04/04/17 0408 04/05/17 0432 04/06/17 0536  NA  --  137 138 143 142 140  K  --  3.8 4.3 4.2 4.0 4.0  CL  --  101 102 107 104 104  CO2  --  '26 27 29 30 25  '$ GLUCOSE  --  98 117* 109* 108* 102*  BUN  --  '14 12 8 8 9  '$ CREATININE  --  1.21 1.23 1.20 1.32* 1.24  CALCIUM  --  8.6* 8.5* 8.8* 8.8* 9.0  MG 2.1  --  2.1 2.0 2.1 2.6*  PHOS 2.6  --  4.2 4.4 4.7* 4.0   Liver Function Tests: Recent Labs  Lab 04/02/17 0441 04/03/17 0422 04/04/17 0408 04/05/17 0432 04/06/17 0536  AST 33 34 25 35 61*  ALT 37 42 36 43 70*  ALKPHOS 159* 161* 208* 237* 235*  BILITOT 0.9 0.7 0.4 0.8 0.5  PROT 6.4* 6.0* 6.3* 6.4* 6.7  ALBUMIN 3.0* 2.7* 3.0* 2.9* 2.8*   Recent Labs  Lab 04/01/17 1419  LIPASE 26   No results for  input(s): AMMONIA in the last 168 hours. CBC: Recent Labs  Lab 04/01/17 1419 04/02/17 0441 04/03/17 0422 04/04/17 0408 04/05/17 0432 04/06/17 0536  WBC 18.4* 16.1* 14.4* 14.2* 14.8* 16.5*  NEUTROABS 15.9*  --  11.1* 10.4* 10.5* 12.7*  HGB 13.4 11.8* 11.4* 11.2* 11.1* 12.3*  HCT 38.5* 36.0* 33.9* 33.6* 33.0* 35.9*  MCV 88.1 90.5 89.2 89.6 89.9 90.0  PLT 209 205 210 294 360 415*   Cardiac Enzymes: No results for input(s): CKTOTAL, CKMB, CKMBINDEX, TROPONINI in the last 168 hours. BNP: Invalid input(s): POCBNP CBG: Recent Labs  Lab 04/02/17 0904 04/03/17 0731 04/04/17 0742 04/05/17 0733  GLUCAP 89 112* 109* 121*   D-Dimer No results for input(s): DDIMER in the last 72 hours. Hgb A1c No results for input(s): HGBA1C in the last 72  hours. Lipid Profile No results for input(s): CHOL, HDL, LDLCALC, TRIG, CHOLHDL, LDLDIRECT in the last 72 hours. Thyroid function studies No results for input(s): TSH, T4TOTAL, T3FREE, THYROIDAB in the last 72 hours.  Invalid input(s): FREET3 Anemia work up No results for input(s): VITAMINB12, FOLATE, FERRITIN, TIBC, IRON, RETICCTPCT in the last 72 hours. Urinalysis    Component Value Date/Time   COLORURINE YELLOW 04/01/2017 2030   APPEARANCEUR CLEAR 04/01/2017 2030   LABSPEC 1.013 04/01/2017 2030   PHURINE 5.0 04/01/2017 2030   GLUCOSEU NEGATIVE 04/01/2017 2030   HGBUR MODERATE (A) 04/01/2017 2030   [place] NEGATIVE 04/01/2017 2030   BILIRUBINUR negative 09/29/2014 0856   BILIRUBINUR neg 11/01/2013 0907   KETONESUR NEGATIVE 04/01/2017 2030   PROTEINUR NEGATIVE 04/01/2017 2030   UROBILINOGEN 0.2 09/29/2014 0856   NITRITE NEGATIVE 04/01/2017 2030   LEUKOCYTESUR NEGATIVE 04/01/2017 2030   Sepsis Labs Invalid input(s): PROCALCITONIN,  WBC,  LACTICIDVEN Microbiology No results found for this or any previous visit (from the past 240 hour(s)).  Time coordinating discharge: 35 minutes  SIGNED:  Kerney Elbe, DO [facility] 04/06/2017, 12:10 PM Pager [phone]  If 7PM-7AM, please contact night-coverage [email or web address] Password [email or web address]

## 2017-04-06 NOTE — Progress Notes (Signed)
[facility] Office:  [phone] General Surgery Progress Note   LOS: 5 days  POD -     Chief Complaint: Abdominal pain  Assessment and Plan: 1.  Appendicitis   WBC - 16,500 - 04/06/2017  Zosyn  Plan:  To advance to reg diet.  If tolerated, could go home later today or tomorrow AM.  Will need a total of 14 days of antibiotics (change to augmentin).  See Dr. Andrey CampanileWilson or Dr. Daphine DeutscherMartin back in our office in about 3 weeks (maybe a week after the antibiotics are complete.  His last colonoscopy was maybe 10 years ago - so this will need to be updated - either before or after appendiceal surgery.  WBC up a little, but clinically, continues to get better.  I would let him go home with follow up with Dr. Andrey CampanileWilson or Daphine DeutscherMartin.  2.  HTN 3.  Depression 4.  Gout  Left elbow pain today - okay to take NSAIDs 5.  History of Bell's Palsy 6.  DVT prophylaxis - Lovenox 7.  Subpleural pulmonary nodule  Follow up in 12 months 8.  CKD   Creat - 1.24 - 04/06/2017   Active Problems:   Bell's palsy   Gout   Appendicitis   Lung nodules   HLD (hyperlipidemia)   Depression   Essential hypertension   Sepsis ([facility])   Acute perforated appendicitis  Subjective:  Doing better.  Tolerated reg diet.    Main complaint is left elbow pain.  Wife in room with patient.  Objective:   Vitals:   04/05/17 2043 04/06/17 0624  BP: (!) 147/81 (!) 153/86  Pulse: (!) 52 (!) 52  Resp: 17 18  Temp: 99.6 F (37.6 C) 98 F (36.7 C)  SpO2: 96% 97%     Intake/Output from previous day:  03/30 0701 - 03/31 0700 In: 1483.8 [P.O.:700; I.V.:633.8; IV Piggyback:150] Out: 0   Intake/Output this shift:  No intake/output data recorded.   Physical Exam:   General: WN WM who is alert and oriented.    HEENT: Normal. Pupils equal. .   Lungs: Clear   Abdomen: RLQ tenderness almost completely resolved.   Extremities:  Left elbow pain - minimal swelling - no redness   Lab Results:    Recent Labs     04/05/17 0432 04/06/17 0536  WBC 14.8* 16.5*  HGB 11.1* 12.3*  HCT 33.0* 35.9*  PLT 360 415*    BMET   Recent Labs    04/05/17 0432 04/06/17 0536  NA 142 140  K 4.0 4.0  CL 104 104  CO2 30 25  GLUCOSE 108* 102*  BUN 8 9  CREATININE 1.32* 1.24  CALCIUM 8.8* 9.0    PT/INR  No results for input(s): LABPROT, INR in the last 72 hours.  ABG  No results for input(s): PHART, HCO3 in the last 72 hours.  Invalid input(s): PCO2, PO2   Studies/Results:  No results found.   Anti-infectives:   Anti-infectives (From admission, onward)   Start     Dose/Rate Route Frequency Ordered Stop   04/02/17 0200  piperacillin-tazobactam (ZOSYN) IVPB 3.375 g     3.375 g 12.5 mL/hr over 240 Minutes Intravenous Every 8 hours 04/01/17 1944     04/01/17 1915  piperacillin-tazobactam (ZOSYN) IVPB 3.375 g     3.375 g 100 mL/hr over 30 Minutes Intravenous  Once 04/01/17 1900 04/01/17 2010      Ovidio Kinavid Essence Merle, MD, FACS Pager: [phone] [facility] Office: [phone] 04/06/2017

## 2017-04-07 LAB — GLUCOSE, CAPILLARY: GLUCOSE-CAPILLARY: 110 mg/dL — AB (ref 65–99)

## 2017-04-16 ENCOUNTER — Other Ambulatory Visit: Payer: Self-pay | Admitting: General Surgery

## 2017-04-16 DIAGNOSIS — K3532 Acute appendicitis with perforation and localized peritonitis, without abscess: Secondary | ICD-10-CM

## 2017-04-17 ENCOUNTER — Ambulatory Visit: Payer: Self-pay | Admitting: General Surgery

## 2017-04-17 ENCOUNTER — Ambulatory Visit
Admission: RE | Admit: 2017-04-17 | Discharge: 2017-04-17 | Disposition: A | Discharge status: Home / Self Care | Payer: PRIVATE HEALTH INSURANCE | Source: Ambulatory Visit | Attending: General Surgery | Admitting: General Surgery

## 2017-04-17 DIAGNOSIS — K3532 Acute appendicitis with perforation and localized peritonitis, without abscess: Secondary | ICD-10-CM

## 2017-04-17 MED ORDER — IOPAMIDOL (ISOVUE-300) INJECTION 61%
100.0000 mL | Freq: Once | INTRAVENOUS | Status: AC | PRN
  Administered 2017-04-17: 100 mL via INTRAVENOUS

## 2017-05-01 NOTE — Patient Instructions (Addendum)
Dailey Buccheri ZOXWRUE  05/01/2017   Your procedure is scheduled on: 05-08-17   Report to [facility] Main  Entrance    Report to admitting at 5:30AM     Call this number if you have problems the morning of surgery [phone]     Remember: NO SOLID FOOD AFTER MIDNIGHT THE NIGHT PRIOR TO SURGERY. NOTHING BY MOUTH EXCEPT CLEAR LIQUIDS UNTIL 3 HOURS PRIOR TO SCHEULED SURGERY. PLEASE FINISH ENSURE DRINK PER SURGEON ORDER 3 HOURS PRIOR TO SCHEDULED SURGERY TIME WHICH NEEDS TO BE COMPLETED AT __4:30AM____.     Take these medicines the morning of surgery with A SIP OF WATER: allopurinol, rosuvastatin                                 You may not have any metal on your body including hair pins and              piercings  Do not wear jewelry, make-up, lotions, powders or perfumes, deodorant                       Men may shave face and neck.   Do not bring valuables to the hospital. [facility] IS NOT             RESPONSIBLE   FOR VALUABLES.  Contacts, dentures or bridgework may not be worn into surgery.  Leave suitcase in the car. After surgery it may be brought to your room.                 Please read over the following fact sheets you were given: _____________________________________________________________________    CLEAR LIQUID DIET   Foods Allowed                                                                     Foods Excluded  Coffee and tea, regular and decaf                             liquids that you cannot  Plain Jell-O in any flavor                                             see through such as: Fruit ices (not with fruit pulp)                                     milk, soups, orange juice  Iced Popsicles                                    All solid food Carbonated beverages, regular and diet  Cranberry, grape and apple juices Sports drinks like Gatorade Lightly seasoned clear broth or consume(fat free) Sugar,  honey syrup  Sample Menu Breakfast                                Lunch                                     Supper Cranberry juice                    Beef broth                            Chicken broth Jell-O                                     Grape juice                           Apple juice Coffee or tea                        Jell-O                                      Popsicle                                                Coffee or tea                        Coffee or tea  _____________________________________________________________________              [facility] - Preparing for Surgery Before surgery, you can play an important role.  Because skin is not sterile, your skin needs to be as free of germs as possible.  You can reduce the number of germs on your skin by washing with CHG (chlorahexidine gluconate) soap before surgery.  CHG is an antiseptic cleaner which kills germs and bonds with the skin to continue killing germs even after washing. Please DO NOT use if you have an allergy to CHG or antibacterial soaps.  If your skin becomes reddened/irritated stop using the CHG and inform your nurse when you arrive at Short Stay. Do not shave (including legs and underarms) for at least 48 hours prior to the first CHG shower.  You may shave your face/neck. Please follow these instructions carefully:  1.  Shower with CHG Soap the night before surgery and the  morning of Surgery.  2.  If you choose to wash your hair, wash your hair first as usual with your  normal  shampoo.  3.  After you shampoo, rinse your hair and body thoroughly to remove the  shampoo.                           4.  Use CHG as you would any other liquid soap.  You can apply  chg directly  to the skin and wash                       Gently with a scrungie or clean washcloth.  5.  Apply the CHG Soap to your body ONLY FROM THE NECK DOWN.   Do not use on face/ open                           Wound or open sores. Avoid contact  with eyes, ears mouth and genitals (private parts).                       Wash face,  Genitals (private parts) with your normal soap.             6.  Wash thoroughly, paying special attention to the area where your surgery  will be performed.  7.  Thoroughly rinse your body with warm water from the neck down.  8.  DO NOT shower/wash with your normal soap after using and rinsing off  the CHG Soap.                9.  Pat yourself dry with a clean towel.            10.  Wear clean pajamas.            11.  Place clean sheets on your bed the night of your first shower and do not  sleep with pets. Day of Surgery : Do not apply any lotions/deodorants the morning of surgery.  Please wear clean clothes to the hospital/surgery center.  FAILURE TO FOLLOW THESE INSTRUCTIONS MAY RESULT IN THE CANCELLATION OF YOUR SURGERY PATIENT SIGNATURE_________________________________  NURSE SIGNATURE__________________________________  ________________________________________________________________________

## 2017-05-01 NOTE — Progress Notes (Signed)
[facility] . DR Royann ShiversROITORU 01-01-17 Epic   STRESS TEST 01-01-17 Epic   EKG 01-01-17 Epic

## 2017-05-02 ENCOUNTER — Other Ambulatory Visit: Payer: Self-pay

## 2017-05-02 ENCOUNTER — Other Ambulatory Visit (HOSPITAL_COMMUNITY): Payer: PRIVATE HEALTH INSURANCE

## 2017-05-02 ENCOUNTER — Encounter (HOSPITAL_COMMUNITY): Payer: Self-pay

## 2017-05-02 ENCOUNTER — Encounter (HOSPITAL_COMMUNITY)
Admission: RE | Admit: 2017-05-02 | Discharge: 2017-05-02 | Disposition: A | Discharge status: Home / Self Care | Payer: PRIVATE HEALTH INSURANCE | Source: Ambulatory Visit | Attending: General Surgery | Admitting: General Surgery

## 2017-05-02 DIAGNOSIS — Z01812 Encounter for preprocedural laboratory examination: Secondary | ICD-10-CM | POA: Insufficient documentation

## 2017-05-02 HISTORY — DX: Cardiac murmur, unspecified: R01.1

## 2017-05-02 HISTORY — DX: Bradycardia, unspecified: R00.1

## 2017-05-02 HISTORY — DX: Dyspnea, unspecified: R06.00

## 2017-05-02 HISTORY — DX: Personal history of other diseases of the digestive system: Z87.19

## 2017-05-02 LAB — COMPREHENSIVE METABOLIC PANEL
ALT: 22 U/L (ref 17–63)
ANION GAP: 7 (ref 5–15)
AST: 20 U/L (ref 15–41)
Albumin: 4 g/dL (ref 3.5–5.0)
Alkaline Phosphatase: 65 U/L (ref 38–126)
BILIRUBIN TOTAL: 0.7 mg/dL (ref 0.3–1.2)
BUN: 18 mg/dL (ref 6–20)
CHLORIDE: 109 mmol/L (ref 101–111)
CO2: 24 mmol/L (ref 22–32)
Calcium: 9.1 mg/dL (ref 8.9–10.3)
Creatinine, Ser: 1 mg/dL (ref 0.61–1.24)
GFR calc non Af Amer: >60 mL/min (ref >60)
Glucose, Bld: 101 mg/dL — ABNORMAL HIGH (ref 65–99)
Potassium: 4.5 mmol/L (ref 3.5–5.1)
Sodium: 140 mmol/L (ref 135–145)
TOTAL PROTEIN: 6.5 g/dL (ref 6.5–8.1)

## 2017-05-02 LAB — CBC WITH DIFFERENTIAL/PLATELET
BASOS ABS: 0.1 10*3/uL (ref 0.0–0.1)
Basophils Relative: 1 %
EOS PCT: 4 %
Eosinophils Absolute: 0.4 10*3/uL (ref 0.0–0.7)
HEMATOCRIT: 40.5 % (ref 39.0–52.0)
Hemoglobin: 13.2 g/dL (ref 13.0–17.0)
LYMPHS PCT: 26 %
Lymphs Abs: 2.2 10*3/uL (ref 0.7–4.0)
MCH: 29.4 pg (ref 26.0–34.0)
MCHC: 32.6 g/dL (ref 30.0–36.0)
MCV: 90.2 fL (ref 78.0–100.0)
MONO ABS: 0.6 10*3/uL (ref 0.1–1.0)
MONOS PCT: 7 %
NEUTROS ABS: 5.4 10*3/uL (ref 1.7–7.7)
Neutrophils Relative %: 62 %
PLATELETS: 260 10*3/uL (ref 150–400)
RBC: 4.49 MIL/uL (ref 4.22–5.81)
RDW: 13.8 % (ref 11.5–15.5)
WBC: 8.6 10*3/uL (ref 4.0–10.5)

## 2017-05-08 ENCOUNTER — Ambulatory Visit (HOSPITAL_COMMUNITY): Payer: PRIVATE HEALTH INSURANCE | Admitting: Certified Registered Nurse Anesthetist

## 2017-05-08 ENCOUNTER — Encounter (HOSPITAL_COMMUNITY)
Admission: AD | Disposition: A | Discharge status: Home / Self Care | Payer: Self-pay | Source: Ambulatory Visit | Attending: General Surgery

## 2017-05-08 ENCOUNTER — Other Ambulatory Visit: Payer: Self-pay

## 2017-05-08 ENCOUNTER — Encounter (HOSPITAL_COMMUNITY): Payer: Self-pay

## 2017-05-08 ENCOUNTER — Inpatient Hospital Stay (HOSPITAL_COMMUNITY)
Admission: AD | Admit: 2017-05-08 | Discharge: 2017-05-13 | DRG: 331 | Disposition: A | Discharge status: Home / Self Care | Payer: PRIVATE HEALTH INSURANCE | Source: Ambulatory Visit | Attending: General Surgery | Admitting: General Surgery

## 2017-05-08 DIAGNOSIS — I10 Essential (primary) hypertension: Secondary | ICD-10-CM | POA: Diagnosis present

## 2017-05-08 DIAGNOSIS — Z8049 Family history of malignant neoplasm of other genital organs: Secondary | ICD-10-CM | POA: Diagnosis not present

## 2017-05-08 DIAGNOSIS — K660 Peritoneal adhesions (postprocedural) (postinfection): Secondary | ICD-10-CM | POA: Diagnosis present

## 2017-05-08 DIAGNOSIS — Z8041 Family history of malignant neoplasm of ovary: Secondary | ICD-10-CM

## 2017-05-08 DIAGNOSIS — R109 Unspecified abdominal pain: Secondary | ICD-10-CM | POA: Diagnosis present

## 2017-05-08 DIAGNOSIS — M109 Gout, unspecified: Secondary | ICD-10-CM | POA: Diagnosis present

## 2017-05-08 DIAGNOSIS — Z8249 Family history of ischemic heart disease and other diseases of the circulatory system: Secondary | ICD-10-CM | POA: Diagnosis not present

## 2017-05-08 DIAGNOSIS — K381 Appendicular concretions: Secondary | ICD-10-CM | POA: Diagnosis present

## 2017-05-08 DIAGNOSIS — E7800 Pure hypercholesterolemia, unspecified: Secondary | ICD-10-CM | POA: Diagnosis present

## 2017-05-08 DIAGNOSIS — K3532 Acute appendicitis with perforation and localized peritonitis, without abscess: Secondary | ICD-10-CM | POA: Diagnosis present

## 2017-05-08 HISTORY — PX: LAPAROSCOPIC APPENDECTOMY: SHX408

## 2017-05-08 SURGERY — APPENDECTOMY, LAPAROSCOPIC
Anesthesia: General

## 2017-05-08 MED ORDER — GABAPENTIN 300 MG PO CAPS
300.0000 mg | ORAL_CAPSULE | ORAL | Status: AC
  Administered 2017-05-08: 300 mg via ORAL
  Filled 2017-05-08: qty 1

## 2017-05-08 MED ORDER — DIPHENHYDRAMINE HCL 50 MG/ML IJ SOLN: 12.5000 mg | Freq: Four times a day (QID) | INTRAMUSCULAR | Status: DC | PRN

## 2017-05-08 MED ORDER — BUPIVACAINE-EPINEPHRINE 0.25% -1:200000 IJ SOLN
INTRAMUSCULAR | Status: DC | PRN
  Administered 2017-05-08: 30 mL

## 2017-05-08 MED ORDER — SUGAMMADEX SODIUM 200 MG/2ML IV SOLN
INTRAVENOUS | Status: DC | PRN
Start: 2017-05-08 — End: 2017-05-08
  Administered 2017-05-08: 200 mg via INTRAVENOUS

## 2017-05-08 MED ORDER — ROCURONIUM BROMIDE 10 MG/ML (PF) SYRINGE
PREFILLED_SYRINGE | INTRAVENOUS | Status: AC
  Filled 2017-05-08: qty 5

## 2017-05-08 MED ORDER — ACETAMINOPHEN 500 MG PO TABS
1000.0000 mg | ORAL_TABLET | ORAL | Status: AC
  Administered 2017-05-08: 1000 mg via ORAL
  Filled 2017-05-08: qty 2

## 2017-05-08 MED ORDER — HYDROMORPHONE HCL 1 MG/ML IJ SOLN
0.2500 mg | INTRAMUSCULAR | Status: DC | PRN
  Administered 2017-05-08 (×4): 0.5 mg via INTRAVENOUS

## 2017-05-08 MED ORDER — CEFOTETAN DISODIUM-DEXTROSE 2-2.08 GM-%(50ML) IV SOLR
2.0000 g | INTRAVENOUS | Status: AC
  Administered 2017-05-08: 2 g via INTRAVENOUS
  Filled 2017-05-08: qty 50

## 2017-05-08 MED ORDER — DIPHENHYDRAMINE HCL 12.5 MG/5ML PO ELIX: 12.5000 mg | ORAL_SOLUTION | Freq: Four times a day (QID) | ORAL | Status: DC | PRN

## 2017-05-08 MED ORDER — FENTANYL CITRATE (PF) 100 MCG/2ML IJ SOLN
INTRAMUSCULAR | Status: AC
  Filled 2017-05-08: qty 2

## 2017-05-08 MED ORDER — LACTATED RINGERS IV SOLN
INTRAVENOUS | Status: DC | PRN
  Administered 2017-05-08 (×2): via INTRAVENOUS

## 2017-05-08 MED ORDER — GLYCOPYRROLATE 0.2 MG/ML IJ SOLN
INTRAMUSCULAR | Status: DC | PRN
  Administered 2017-05-08: 0.2 mg via INTRAVENOUS

## 2017-05-08 MED ORDER — ONDANSETRON HCL 4 MG PO TABS: 4.0000 mg | ORAL_TABLET | Freq: Four times a day (QID) | ORAL | Status: DC | PRN

## 2017-05-08 MED ORDER — CHLORHEXIDINE GLUCONATE 4 % EX LIQD: 60.0000 mL | Freq: Once | CUTANEOUS | Status: DC

## 2017-05-08 MED ORDER — OXYCODONE HCL 5 MG/5ML PO SOLN
5.0000 mg | Freq: Once | ORAL | Status: DC | PRN
  Filled 2017-05-08: qty 5

## 2017-05-08 MED ORDER — FENTANYL CITRATE (PF) 100 MCG/2ML IJ SOLN
INTRAMUSCULAR | Status: DC | PRN
  Administered 2017-05-08 (×5): 50 ug via INTRAVENOUS
  Administered 2017-05-08 (×2): 25 ug via INTRAVENOUS
  Administered 2017-05-08: 50 ug via INTRAVENOUS

## 2017-05-08 MED ORDER — ONDANSETRON HCL 4 MG/2ML IJ SOLN: 4.0000 mg | Freq: Four times a day (QID) | INTRAMUSCULAR | Status: DC | PRN

## 2017-05-08 MED ORDER — ONDANSETRON HCL 4 MG/2ML IJ SOLN
INTRAMUSCULAR | Status: DC | PRN
Start: 2017-05-08 — End: 2017-05-08
  Administered 2017-05-08: 4 mg via INTRAVENOUS

## 2017-05-08 MED ORDER — MORPHINE SULFATE (PF) 2 MG/ML IV SOLN
1.0000 mg | INTRAVENOUS | Status: DC | PRN
  Administered 2017-05-08 (×2): 2 mg via INTRAVENOUS
  Filled 2017-05-08 (×2): qty 1

## 2017-05-08 MED ORDER — 0.9 % SODIUM CHLORIDE (POUR BTL) OPTIME
TOPICAL | Status: DC | PRN
  Administered 2017-05-08: 2000 mL

## 2017-05-08 MED ORDER — KCL IN DEXTROSE-NACL 20-5-0.45 MEQ/L-%-% IV SOLN
INTRAVENOUS | Status: DC
  Administered 2017-05-08 (×2): via INTRAVENOUS
  Administered 2017-05-09: 1000 mL via INTRAVENOUS
  Administered 2017-05-10: 08:00:00 via INTRAVENOUS
  Filled 2017-05-08 (×4): qty 1000

## 2017-05-08 MED ORDER — LIDOCAINE 2% (20 MG/ML) 5 ML SYRINGE
INTRAMUSCULAR | Status: AC
  Filled 2017-05-08: qty 5

## 2017-05-08 MED ORDER — KETOROLAC TROMETHAMINE 15 MG/ML IJ SOLN
INTRAMUSCULAR | Status: AC
  Filled 2017-05-08: qty 1

## 2017-05-08 MED ORDER — LISINOPRIL 20 MG PO TABS
40.0000 mg | ORAL_TABLET | Freq: Every day | ORAL | Status: DC
  Administered 2017-05-09 – 2017-05-13 (×5): 40 mg via ORAL
  Filled 2017-05-08 (×5): qty 2

## 2017-05-08 MED ORDER — DEXAMETHASONE SODIUM PHOSPHATE 10 MG/ML IJ SOLN
INTRAMUSCULAR | Status: DC | PRN
  Administered 2017-05-08: 10 mg via INTRAVENOUS

## 2017-05-08 MED ORDER — LIDOCAINE 2% (20 MG/ML) 5 ML SYRINGE
INTRAMUSCULAR | Status: DC | PRN
  Administered 2017-05-08: 80 mg via INTRAVENOUS

## 2017-05-08 MED ORDER — PROMETHAZINE HCL 25 MG/ML IJ SOLN: 6.2500 mg | INTRAMUSCULAR | Status: DC | PRN

## 2017-05-08 MED ORDER — ALLOPURINOL 100 MG PO TABS
100.0000 mg | ORAL_TABLET | Freq: Every day | ORAL | Status: DC
  Administered 2017-05-09 – 2017-05-13 (×5): 100 mg via ORAL
  Filled 2017-05-08 (×5): qty 1

## 2017-05-08 MED ORDER — FENTANYL CITRATE (PF) 250 MCG/5ML IJ SOLN
INTRAMUSCULAR | Status: AC
  Filled 2017-05-08: qty 5

## 2017-05-08 MED ORDER — MIDAZOLAM HCL 2 MG/2ML IJ SOLN
INTRAMUSCULAR | Status: AC
  Filled 2017-05-08: qty 2

## 2017-05-08 MED ORDER — ENOXAPARIN SODIUM 40 MG/0.4ML ~~LOC~~ SOLN
40.0000 mg | SUBCUTANEOUS | Status: DC
  Administered 2017-05-09 – 2017-05-13 (×5): 40 mg via SUBCUTANEOUS
  Filled 2017-05-08 (×5): qty 0.4

## 2017-05-08 MED ORDER — MIDAZOLAM HCL 5 MG/5ML IJ SOLN
INTRAMUSCULAR | Status: DC | PRN
  Administered 2017-05-08: 2 mg via INTRAVENOUS

## 2017-05-08 MED ORDER — EPHEDRINE SULFATE 50 MG/ML IJ SOLN
INTRAMUSCULAR | Status: DC | PRN
  Administered 2017-05-08: 5 mg via INTRAVENOUS
  Administered 2017-05-08 (×2): 10 mg via INTRAVENOUS

## 2017-05-08 MED ORDER — DEXAMETHASONE SODIUM PHOSPHATE 10 MG/ML IJ SOLN
INTRAMUSCULAR | Status: AC
  Filled 2017-05-08: qty 1

## 2017-05-08 MED ORDER — ALUM & MAG HYDROXIDE-SIMETH 200-200-20 MG/5ML PO SUSP: 30.0000 mL | Freq: Four times a day (QID) | ORAL | Status: DC | PRN

## 2017-05-08 MED ORDER — ROCURONIUM BROMIDE 50 MG/5ML IV SOSY
PREFILLED_SYRINGE | INTRAVENOUS | Status: DC | PRN
Start: 2017-05-08 — End: 2017-05-08
  Administered 2017-05-08: 40 mg via INTRAVENOUS
  Administered 2017-05-08 (×8): 10 mg via INTRAVENOUS

## 2017-05-08 MED ORDER — PROPOFOL 10 MG/ML IV BOLUS
INTRAVENOUS | Status: DC | PRN
  Administered 2017-05-08: 200 mg via INTRAVENOUS

## 2017-05-08 MED ORDER — HYDROMORPHONE HCL 1 MG/ML IJ SOLN
INTRAMUSCULAR | Status: AC
  Filled 2017-05-08: qty 1

## 2017-05-08 MED ORDER — OXYCODONE HCL 5 MG PO TABS: 5.0000 mg | ORAL_TABLET | Freq: Once | ORAL | Status: DC | PRN

## 2017-05-08 MED ORDER — KETOROLAC TROMETHAMINE 15 MG/ML IJ SOLN
15.0000 mg | Freq: Four times a day (QID) | INTRAMUSCULAR | Status: DC | PRN
  Administered 2017-05-08: 15 mg via INTRAVENOUS

## 2017-05-08 MED ORDER — PROPOFOL 10 MG/ML IV BOLUS
INTRAVENOUS | Status: AC
  Filled 2017-05-08: qty 20

## 2017-05-08 MED ORDER — GABAPENTIN 300 MG PO CAPS
300.0000 mg | ORAL_CAPSULE | Freq: Two times a day (BID) | ORAL | Status: DC
  Administered 2017-05-08 – 2017-05-13 (×10): 300 mg via ORAL
  Filled 2017-05-08 (×10): qty 1

## 2017-05-08 MED ORDER — ACETAMINOPHEN 500 MG PO TABS
1000.0000 mg | ORAL_TABLET | Freq: Four times a day (QID) | ORAL | Status: AC
  Administered 2017-05-08 – 2017-05-09 (×3): 1000 mg via ORAL
  Filled 2017-05-08 (×3): qty 2

## 2017-05-08 MED ORDER — GLYCOPYRROLATE 0.2 MG/ML IV SOSY
PREFILLED_SYRINGE | INTRAVENOUS | Status: AC
  Filled 2017-05-08: qty 5

## 2017-05-08 MED ORDER — SODIUM CHLORIDE 0.9 % IV SOLN
2.0000 g | Freq: Two times a day (BID) | INTRAVENOUS | Status: AC
  Administered 2017-05-08: 2 g via INTRAVENOUS
  Filled 2017-05-08: qty 2

## 2017-05-08 MED ORDER — BUPIVACAINE HCL (PF) 0.25 % IJ SOLN
INTRAMUSCULAR | Status: AC
  Filled 2017-05-08: qty 30

## 2017-05-08 MED ORDER — PROMETHAZINE HCL 25 MG/ML IJ SOLN: 12.5000 mg | Freq: Four times a day (QID) | INTRAMUSCULAR | Status: DC | PRN

## 2017-05-08 MED ORDER — OXYCODONE HCL 5 MG PO TABS
5.0000 mg | ORAL_TABLET | ORAL | Status: DC | PRN
  Administered 2017-05-08 – 2017-05-13 (×21): 10 mg via ORAL
  Administered 2017-05-13: 5 mg via ORAL
  Filled 2017-05-08 (×22): qty 2
  Filled 2017-05-08: qty 1

## 2017-05-08 MED ORDER — BUPIVACAINE LIPOSOME 1.3 % IJ SUSP
20.0000 mL | Freq: Once | INTRAMUSCULAR | Status: AC
  Administered 2017-05-08: 20 mL
  Filled 2017-05-08: qty 20

## 2017-05-08 MED ORDER — ONDANSETRON HCL 4 MG/2ML IJ SOLN
INTRAMUSCULAR | Status: AC
  Filled 2017-05-08: qty 2

## 2017-05-08 SURGICAL SUPPLY — 64 items
APPLIER CLIP 5 13 M/L LIGAMAX5 (MISCELLANEOUS)
APR CLP MED LRG 5 ANG JAW (MISCELLANEOUS)
BAG SPEC RTRVL 10 TROC 200 (ENDOMECHANICALS)
BLADE 10 SAFETY STRL DISP (BLADE) ×2 IMPLANT
BNDG ADH 5X4 AIR PERM ELC (GAUZE/BANDAGES/DRESSINGS) ×3
BNDG COHESIVE 4X5 WHT NS (GAUZE/BANDAGES/DRESSINGS) ×6 IMPLANT
CABLE HIGH FREQUENCY MONO STRZ (ELECTRODE) ×2 IMPLANT
CELLS DAT CNTRL 66122 CELL SVR (MISCELLANEOUS) ×1 IMPLANT
CLIP APPLIE 5 13 M/L LIGAMAX5 (MISCELLANEOUS) IMPLANT
CLOSURE WOUND 1/2 X4 (GAUZE/BANDAGES/DRESSINGS) ×1
COVER MAYO STAND STRL (DRAPES) ×2 IMPLANT
COVER SURGICAL LIGHT HANDLE (MISCELLANEOUS) ×5 IMPLANT
CUTTER FLEX LINEAR 45M (STAPLE) IMPLANT
DECANTER SPIKE VIAL GLASS SM (MISCELLANEOUS) ×1 IMPLANT
DRSG OPSITE POSTOP 4X8 (GAUZE/BANDAGES/DRESSINGS) ×2 IMPLANT
ELECT CAUTERY BLADE 6.4 (BLADE) ×4 IMPLANT
ELECT PENCIL ROCKER SW 15FT (MISCELLANEOUS) ×4 IMPLANT
GLOVE BIO SURGEON STRL SZ7.5 (GLOVE) ×7 IMPLANT
GLOVE INDICATOR 8.0 STRL GRN (GLOVE) ×5 IMPLANT
GLOVE SURG SIGNA 7.5 PF LTX (GLOVE) ×4 IMPLANT
GOWN STRL REUS W/TWL XL LVL3 (GOWN DISPOSABLE) ×10 IMPLANT
GRASPER SUT TROCAR 14GX15 (MISCELLANEOUS) IMPLANT
HANDLE SUCTION POOLE (INSTRUMENTS) IMPLANT
HOLDER FOLEY CATH W/STRAP (MISCELLANEOUS) ×2 IMPLANT
IV LACTATED RINGERS 1000ML (IV SOLUTION) ×3 IMPLANT
KIT BASIN OR (CUSTOM PROCEDURE TRAY) ×5 IMPLANT
NS IRRIG 1000ML POUR BTL (IV SOLUTION) ×6 IMPLANT
PACK GENERAL/GYN (CUSTOM PROCEDURE TRAY) ×2 IMPLANT
POUCH RETRIEVAL ECOSAC 10 (ENDOMECHANICALS) ×1 IMPLANT
POUCH RETRIEVAL ECOSAC 10MM (ENDOMECHANICALS)
RELOAD 45 VASCULAR/THIN (ENDOMECHANICALS) IMPLANT
RELOAD PROXIMATE 75MM BLUE (ENDOMECHANICALS) ×9 IMPLANT
RELOAD STAPLE 45 2.5 WHT GRN (ENDOMECHANICALS) IMPLANT
RELOAD STAPLE 45 3.5 BLU ETS (ENDOMECHANICALS) IMPLANT
RELOAD STAPLE 75 3.8 BLU REG (ENDOMECHANICALS) IMPLANT
RELOAD STAPLE TA45 3.5 REG BLU (ENDOMECHANICALS) IMPLANT
RETRACTOR WND ALEXIS 18 MED (MISCELLANEOUS) IMPLANT
RTRCTR WOUND ALEXIS 18CM MED (MISCELLANEOUS) ×3
SCISSORS LAP 5X35 DISP (ENDOMECHANICALS) ×2 IMPLANT
SET IRRIG TUBING LAPAROSCOPIC (IRRIGATION / IRRIGATOR) ×3 IMPLANT
SHEARS HARMONIC ACE PLUS 36CM (ENDOMECHANICALS) ×3 IMPLANT
SLEEVE XCEL OPT CAN 5 100 (ENDOMECHANICALS) ×7 IMPLANT
STAPLER GUN LINEAR PROX 60 (STAPLE) ×2 IMPLANT
STAPLER PROXIMATE 75MM BLUE (STAPLE) ×2 IMPLANT
STAPLER VISISTAT 35W (STAPLE) ×2 IMPLANT
STRIP CLOSURE SKIN 1/2X4 (GAUZE/BANDAGES/DRESSINGS) ×1 IMPLANT
SUCTION POOLE HANDLE (INSTRUMENTS) ×3
SUCTION YANKAUER HANDLE (MISCELLANEOUS) ×2 IMPLANT
SUT MNCRL AB 4-0 PS2 18 (SUTURE) ×7 IMPLANT
SUT PDS AB 1 TP1 54 (SUTURE) ×4 IMPLANT
SUT SILK 2 0 (SUTURE) ×3
SUT SILK 2 0 SH CR/8 (SUTURE) ×2 IMPLANT
SUT SILK 2-0 18XBRD TIE 12 (SUTURE) IMPLANT
SUT SILK 3 0 SH CR/8 (SUTURE) ×4 IMPLANT
SUT VICRYL 0 TIES 12 18 (SUTURE) IMPLANT
SYR BULB IRRIGATION 50ML (SYRINGE) ×4 IMPLANT
TOWEL OR 17X26 10 PK STRL BLUE (TOWEL DISPOSABLE) ×3 IMPLANT
TOWEL OR NON WOVEN STRL DISP B (DISPOSABLE) ×2 IMPLANT
TRAY FOLEY W/METER SILVER 16FR (SET/KITS/TRAYS/PACK) ×3 IMPLANT
TRAY LAPAROSCOPIC (CUSTOM PROCEDURE TRAY) ×3 IMPLANT
TROCAR BLADELESS OPT 5 100 (ENDOMECHANICALS) ×3 IMPLANT
TROCAR XCEL BLUNT TIP 100MML (ENDOMECHANICALS) ×3 IMPLANT
TUBING INSUF HEATED (TUBING) ×3 IMPLANT
YANKAUER SUCT BULB TIP 10FT TU (MISCELLANEOUS) ×2 IMPLANT

## 2017-05-08 NOTE — Brief Op Note (Signed)
05/08/2017  12:51 PM  PATIENT:  Dean Weaver  62 y.o. male  PRE-OPERATIVE DIAGNOSIS:  H/O ruptured appendicitis  POST-OPERATIVE DIAGNOSIS:  H/O ruptured appendicitis  PROCEDURE:  Procedure(s): LAPAROSCOPIC APPENDECTOMY CONVERTED TO LAP ASSISTED ILEOCECTOMY ERAS PATHWAY (N/A)  SURGEON:  Surgeon(s) and Role:    * [name], MD - Primary    * Abigail Miyamoto, MD - Assisting  PHYSICIAN ASSISTANT:   ASSISTANTS: see above   ANESTHESIA:   general  EBL:  50 mL   BLOOD ADMINISTERED:none  DRAINS: Urinary Catheter (Foley)   LOCAL MEDICATIONS USED:  OTHER exparel/marcaine  SPECIMEN:  Source of Specimen:  terminal ileum, cecum and residual appendix  DISPOSITION OF SPECIMEN:  PATHOLOGY  COUNTS:  YES  TOURNIQUET:  * No tourniquets in log *  DICTATION: .Other Dictation: Dictation Number [id number]  PLAN OF CARE: Admit to inpatient   PATIENT DISPOSITION:  PACU - hemodynamically stable.   Delay start of Pharmacological VTE agent (>24hrs) due to surgical blood loss or risk of bleeding: no  Dean Weaver. Dean Campanile, MD, FACS General, Bariatric, & Minimally Invasive Surgery [facility] Surgery, [place]

## 2017-05-08 NOTE — Interval H&P Note (Signed)
History and Physical Interval Note:  05/08/2017 7:33 AM  Dean Weaver  has presented today for surgery, with the diagnosis of H/O ruptured appendicitis  The various methods of treatment have been discussed with the patient and family. After consideration of risks, benefits and other options for treatment, the patient has consented to  Procedure(s): LAPAROSCOPIC APPENDECTOMY ERAS PATHWAY (N/A) as a surgical intervention .  The patient's history has been reviewed, patient examined, no change in status, stable for surgery.  I have reviewed the patient's chart and labs.  Questions were answered to the patient's satisfaction.    Follow up CT showed signif improvement in appendicitis therefore went ahead and offered interval lap appy  We rediscussed laparoscopic appendectomy. We discussed the risk and benefits of surgery including but not limited to bleeding, infection, injury to surrounding structures, need to convert to an open procedure, blood clot formation, post operative abscess or wound infection, staple line complications such as leak or bleeding, hernia formation, post operative ileus, need for additional procedures, anesthesia complications, and the typical postoperative course. I explained that the patient should expect a good improvement in their symptoms.  [name]

## 2017-05-08 NOTE — Anesthesia Postprocedure Evaluation (Signed)
Anesthesia Post Note  Patient: Dean Weaver  Procedure(s) Performed: LAPAROSCOPIC APPENDECTOMY CONVERTED TO LAP ASSISTED ILEOCECTOMY ERAS PATHWAY (N/A )     Patient location during evaluation: PACU Anesthesia Type: General Level of consciousness: awake and alert Pain management: pain level controlled Vital Signs Assessment: post-procedure vital signs reviewed and stable Respiratory status: spontaneous breathing, nonlabored ventilation, respiratory function stable and patient connected to nasal cannula oxygen Cardiovascular status: blood pressure returned to baseline and stable Postop Assessment: no apparent nausea or vomiting Anesthetic complications: no    Last Vitals:  Vitals:   05/08/17 1300 05/08/17 1355  BP: (!) 152/91 119/80  Pulse: 72 78  Resp: 16 14  Temp: 36.6 C 36.7 C  SpO2: 99% 99%    Last Pain:  Vitals:   05/08/17 1335  TempSrc:   PainSc: 7                  Ryan P Ellender

## 2017-05-08 NOTE — H&P (Signed)
Dean Weaver Documented: 04/16/2017 8:53 AM Location: [facility] Patient #: [id number] DOB: 02/24/1955 Married / Language: English / Race: White Male   History of Present Illness Dean Weaver; 04/16/2017 10:01 AM) The patient is a 62 year old male who presents with appendicitis. He comes in for follow-up after being initially met in the hospital on March 26 with acute appendicitis with rupture. He was discharged on the 31st. When he came in he had ruptured appendicitis with extensive inflammation the right lower quadrant and an appendicolith. We initially started with medical management and he was able to follow that algorithm. He was continued on IV antibiotics for most of his hospital stay and converted to oral antibiotics on discharge. He is accompanied by his wife today. He states that he feels a lot better than he did while he was in the hospital. He is still not 100%. He denies any fever or chills. He reports some night sweats. He reports daily bowel movements. He states that his appetite is still not 100% but he is eating and drinking throughout the day. He finished his antibiotics last night. He also endorses a little bit of bloating. The right lower quadrant pain that he had in the hospital has completely resolved. He has been monitoring his labs because he is access to phlebotomy. He reports that his CBC has normalized. He denies any melena or hematochezia. He denies any dysuria. He has chronic her being and belching. They are planning to get a [place] in early june  He also complains of left elbow discomfort. He does have a history of gout.   Problem List/Past Medical Dean Hiss M. Redmond Pulling, Weaver; 04/16/2017 10:09 AM) ACUTE APPENDICITIS WITH RUPTURE (K35.32)   Past Surgical History Dean Hiss M. Redmond Pulling, Weaver; 04/16/2017 10:01 AM) No pertinent past surgical history   Diagnostic Studies History Dean Hiss M. Redmond Pulling, Weaver; 04/16/2017 10:01 AM) Colonoscopy  >10 years  ago  Allergies (Dean Weaver, [place]; 04/16/2017 8:54 AM) No Known Drug Allergies [04/16/2017]: Allergies Reconciled   Medication History (Dean Weaver, Weaver; 04/16/2017 8:55 AM) Lisinopril ('40MG'$  Tablet, Oral) Active. Allopurinol ('100MG'$  Tablet, Oral) Active. Ondansetron HCl ('4MG'$  Tablet, Oral) Active. Rosuvastatin Calcium ('10MG'$  Tablet, Oral) Active. Multi-Vitamin (Oral) Active. Aleve ('220MG'$  Tablet, Oral) Active. Medications Reconciled  Social History Dean Hiss M. Redmond Pulling, Weaver; 04/16/2017 10:01 AM) Alcohol use  Occasional alcohol use. Caffeine use  Coffee. No drug use  Tobacco use  Never smoker.  Family History Dean Hiss M. Redmond Pulling, Weaver; 04/16/2017 10:01 AM) Cervical Cancer  Mother. Hypertension  Brother. Migraine Headache  Sister. Ovarian Cancer  Mother. Respiratory Condition  Father.  Other Problems Dean Hiss M. Redmond Pulling, Weaver; 04/16/2017 10:09 AM) Hypercholesterolemia     Review of Systems Dean Hiss M. Ayde Record Weaver; 04/16/2017 10:01 AM) General Present- Fatigue, Night Sweats and Weight Loss. Not Present- Appetite Loss, Chills, Fever and Weight Gain. Skin Not Present- Change in Wart/Mole, Dryness, Hives, Jaundice, New Lesions, Non-Healing Wounds, Rash and Ulcer. HEENT Not Present- Earache, Hearing Loss, Hoarseness, Nose Bleed, Oral Ulcers, Ringing in the Ears, Seasonal Allergies, Sinus Pain, Sore Throat, Visual Disturbances, Wears glasses/contact lenses and Yellow Eyes. Respiratory Not Present- Bloody sputum, Chronic Cough, Difficulty Breathing, Snoring and Wheezing. Breast Not Present- Breast Mass, Breast Pain, Nipple Discharge and Skin Changes. Cardiovascular Not Present- Chest Pain, Difficulty Breathing Lying Down, Leg Cramps, Palpitations, Rapid Heart Rate, Shortness of Breath and Swelling of Extremities. Gastrointestinal Not Present- Abdominal Pain, Bloating, Bloody Stool, Change in Bowel Habits, Chronic diarrhea, Constipation, Difficulty Swallowing, Excessive gas, Gets full  quickly at meals, Hemorrhoids, Indigestion, Nausea, Rectal Pain and Vomiting. Male Genitourinary Not Present- Blood in Urine, Change in Urinary Stream, Frequency, Impotence, Nocturia, Painful Urination, Urgency and Urine Leakage. Musculoskeletal Present- Swelling of Extremities. Not Present- Back Pain, Joint Pain, Joint Stiffness, Muscle Pain and Muscle Weakness. Neurological Not Present- Decreased Memory, Fainting, Headaches, Numbness, Seizures, Tingling, Tremor, Trouble walking and Weakness. Psychiatric Not Present- Anxiety, Bipolar, Change in Sleep Pattern, Depression, Fearful and Frequent crying. Endocrine Not Present- Cold Intolerance, Excessive Hunger, Hair Changes, Heat Intolerance, Hot flashes and New Diabetes.  Vitals (Dean Weaver; 04/16/2017 8:54 AM) 04/16/2017 8:54 AM Weight: 189 lb Height: 68.5in Body Surface Area: 2.01 m Body Mass Index: 28.32 kg/m  Temp.: 98.65F  Pulse: 62 (Regular)  BP: 126/88 (Sitting, Left Arm, Standard)       Physical Exam Dean Hiss M. Yidel Teuscher Weaver; 04/16/2017 10:02 AM) General Mental Status-Alert. General Appearance-Consistent with stated age. Hydration-Well hydrated. Voice-Normal.  Head and Neck Head-normocephalic, atraumatic with no lesions or palpable masses. Trachea-midline. Thyroid Gland Characteristics - normal size and consistency.  Eye Eyeball - Bilateral-Extraocular movements intact. Sclera/Conjunctiva - Bilateral-No scleral icterus.  ENMT Ears -Note: normal ext ears.  Mouth and Throat -Note: lips intact.   Chest and Lung Exam Chest and lung exam reveals -quiet, even and easy respiratory effort with no use of accessory muscles and on auscultation, normal breath sounds, no adventitious sounds and normal vocal resonance. Inspection Chest Wall - Normal. Back - normal.  Breast - Did not examine.  Cardiovascular Cardiovascular examination reveals -normal heart sounds, regular rate and  rhythm with no murmurs and normal pedal pulses bilaterally.  Abdomen Inspection Inspection of the abdomen reveals - No Hernias. Skin - Scar - no surgical scars. Palpation/Percussion Palpation and Percussion of the abdomen reveal - Soft, Non Tender, No Rebound tenderness, No Rigidity (guarding) and No hepatosplenomegaly. Auscultation Auscultation of the abdomen reveals - Bowel sounds normal.  Peripheral Vascular Upper Extremity Palpation - Pulses bilaterally normal.  Neurologic Neurologic evaluation reveals -alert and oriented x 3 with no impairment of recent or remote memory. Mental Status-Normal.  Neuropsychiatric The patient's mood and affect are described as -normal. Judgment and Insight-insight is appropriate concerning matters relevant to self.  Musculoskeletal Normal Exam - Left-Upper Extremity Strength Normal and Lower Extremity Strength Normal. Normal Exam - Right-Upper Extremity Strength Normal and Lower Extremity Strength Normal. Note: mild L elbow swelling. not warm. no cellulitis. no palpable cord in LUE   Lymphatic Head & Neck  General Head & Neck Lymphatics: Bilateral - Description - Normal. Axillary - Did not examine. Femoral & Inguinal - Did not examine.    Assessment & Plan Dean Hiss M. Delecia Vastine Weaver; 04/16/2017 10:09 AM) ACUTE APPENDICITIS WITH RUPTURE (K35.32) Impression: He is recuperating well from his ruptured appendicitis with appendicolith. He and I have had several long discussions while he was in the hospital about management of ruptured appendicitis and different treatment strategies based on his response to medical therapy. I would like to get a CT scan at this point just to evaluate his abdomen to see how the inflammation is doing. I don't think technically he has an ileus per se but I think his small bowel is probably still not 100% back to normal. We discussed that since he does have an appendicolith that the likelihood of recurrent  appendicitis is not necessarily low. If there is still radiological evidence of an appendix as well as appendicolith and I would lean toward offering him interval appendectomy. I do not believe he  needs continued antibiotics since overall he is doing much better and has no pain and is tolerating a diet. I told him the next steps really depend on the CT scan. If I do recommend surgery then I would favor doing it in early May and not sooner in order to allow the inflammation to continue to resolve and increasing the likelihood of [place] being able to do this laparoscopically and only having to do an appendectomy and not an ileocecectomy. I told him I cannot guarantee that he would be safe for international travel in early June. However if we had a uneventful uncomplicated appendectomy he should be fine. He was instructed on what to call for. Once we get the CT results we will call and discuss the next steps. 30 minute conversation with him and his wife today Current Plans Follow Up - Call [facility] office after tests / studies doneto discuss further plans Pt Education - [facility] Free Text Education/Instructions: discussed with patient and provided information.  Leighton Ruff. Redmond Pulling, Weaver, FACS General, Bariatric, & Minimally Invasive Surgery [facility], [place]

## 2017-05-08 NOTE — Op Note (Signed)
NAME: Dean Weaver, Dean Weaver. MEDICAL RECORD ZO:[id number] ACCOUNT [id number] DATE OF BIRTH:Aug 20, 1955 FACILITY: [facility] LOCATION: [id number] PHYSICIAN:Kysa Calais Elson Clan, MD  OPERATIVE REPORT  DATE OF PROCEDURE:  05/08/2017  PREOPERATIVE DIAGNOSIS:  History of ruptured appendicitis.  POSTOPERATIVE DIAGNOSIS:  History of ruptured appendicitis.  PROCEDURE:  Laparoscopic appendectomy converted to laparoscopic-assisted ileocecectomy.  SURGEON:  Dr. [name]  ASSISTANT:  Dr. Carman Ching  ANESTHESIA:  General.  ESTIMATED BLOOD LOSS:  50 mL.  SPECIMENS:  Terminal ileum, cecum and residual appendix.  INDICATIONS:  The patient is a pleasant 62 year old Caucasian male who was admitted approximately 6 weeks ago with ruptured appendicitis.  He was medically managed and his symptoms and pain and white count resolved with bowel rest and IV antibiotics with  subsequent resumption of oral diet.  He was ultimately discharged and continued on some oral antibiotics.  He followed up in the clinic for followup.  On his admission CT he had an appendicolith.  I had an extensive discussion with him and his wife  regarding potential risk of recurrent appendicitis based on the fact that he had an appendicolith.  A repeat CT scan was performed which demonstrated a significant improvement in the inflammation in the right lower quadrant.  The appendix was visualized  along with the appendicolith.  We talked about pros and cons of interval appendectomy versus observation.  He elected to undergo an interval appendectomy.  We had an extensive conversation regarding risks and benefits including but not limited to  bleeding, infection, injury to surrounding structures, need to convert to an open procedure, staple line complications such as bleeding or leak, need to switch to an ileocecectomy based on intraoperative findings, anastomotic leak, anastomotic stricture,  incisional hernia, wound infection, ileus, DVT formation,  perioperative cardiac and pulmonary events as well as the typical recovery.  DESCRIPTION OF PROCEDURE:  He was given enhanced recovery medications by mouth preoperatively.  He was then taken to OR 2 at [facility] and placed supine on the operating table.  General endotracheal anesthesia was established.  A Foley  catheter was placed.  His left arm was tucked at his side with the appropriate padding.  His abdomen was prepped and draped in the usual standard surgical fashion with ChloraPrep.  He received IV antibiotic prior to skin incision.  A surgical timeout was  performed.  I infiltrated local at his umbilical region.  A small vertical infraumbilical incision was made.  Fascia was identified and incised and the abdominal cavity was entered.  A pursestring suture was placed around the fascial edges with 0 Vicryl on a UR-6  needle.  A 12 mm Hasson trocar was placed and pneumoperitoneum was smoothly established up to a patient pressure of 15 mmHg.  The laparoscope was advanced and abdominal cavity was surveilled.  There was no evidence of injury to surrounding structures.   There was evidence of small bowel adhesed to the right mid abdominal wall out laterally.  Two 5 mm trocars were then placed, 1 in the suprapubic location and 1 in the left lower quadrant, all under direct visualization.  The patient was placed in  Trendelenburg and rotated left.  Using atraumatic bowel graspers, I inspected the right lower quadrant and right mid abdomen.  The cecum was visualized.  There was a knuckle of sort of mid ileum adhered to the anterior abdominal wall and it was attached  to the medial aspect of the cecum.  I could visualize the terminal ileum coming out of the cecum,  but it was not this segment that was densely adhered to the anterior medial side of the cecum.  I was able to sharply take down some thin filmy adhesions  between the cecum and the anterior abdominal wall as well as some around the small  bowel to the anterior abdominal wall.  I then placed a 5 mm trocar in the right upper quadrant to place a camera there to see if I could visualize the appendix perhaps  coming out laterally or posterior lateral.  It did not give me much insight with the camera in that location.  I then decided to incise the abdominal wall peritoneum near the small bowel adhesion to the anterior abdominal wall, leaving a small cuff of  peritoneum on the small bowel.  In incising this sharply without electrocautery, it became evident that I entered sort of a phlegmon area on the anterior abdominal wall.  Then, using the suction irrigator catheter, I was able to bluntly peel this away  from the anterior abdominal wall.  There was no evidence of enterotomy.  At this point, the bowel was completely freed from the anterior abdominal wall.  Again, I still had a section of mid ileum densely adhered to the anterior medial side of the cecum.   I could visualize the terminal ileum, but I still could not visualize the exact location of the appendix.  At this point, I decided to mobilize some of the cecum, taking down the white line of Toldt with EndoShears along with a Harmonic scalpel, and  trying to mobilize and roll the cecum a little bit more medially.  This area was a little bit friable; however, I stayed anterior to Gerota's fascia as well as well away from the right ureter.  At this point, it became apparent that I was not going to be  able to complete the procedure entirely laparoscopic since I still had not completely visualized the appendix.  I felt that I had perhaps been at the base of the appendix laparoscopic, but I could not see the body or tip of the appendix.  At this point,  I decided that I would need to convert to a mini laparotomy and perhaps prepare to be doing an ileocecectomy.  Therefore, I decided to go ahead and mobilize the distal ileum.  It had some lateral attachments between the antimesenteric border of the   terminal ileum to the pelvic inlet.  This was taken down with EndoShears without electrocautery.  I then incised the peritoneal reflection along the distal ileum in order to lift it up to get behind the terminal ileum.  I then continued to mobilize some  of the ascending colon with the Harmonic scalpel.  At this point, Dr. Magnus Ivan joined me in the operating room per my request to help visualize the anatomy and confirm the anatomy.  We both concurred that we had reached maximal point for laparoscopic  surgery.  I therefore extended the Hasson trocar incision to above the umbilicus with a 10 blade, extending it to the upper midline some.  Subcutaneous tissue was divided with electrocautery and the fascia was entered and the fascia was divided and the  abdominal cavity was entered.  A wound protector was placed.  It was then pneumoperitoneum had been released.  Laparoscopic instruments were then tucked under some blue towels.  I was able to grab the cecum and pull it out through the fascial opening,  along with the terminal ileum, along with this mid ileum  that was adhered to the anterior medial aspect of the cecum.  I was able to finger fracture this off.  The mid ileum was intact.  There was no enterotomy.  There was probably about half a  centimeter or rind left on the mid ileum at this point,  I left it alone.  We then began to further inspect the base of the cecum.  We visualized where the terminal ileum entered the cecum and focused our area downstream from that to identify the  appendix.  It appeared that the appendix was essentially, for lack of better description, obliterated.  We identified what appeared to be sort of the mid body of the appendix and the base of the appendix.  This area was very indurated and essentially  fell apart.  There was 1 tiny drop of stool that was evident.  We identified the base where it had sort of fallen apart and we were able to put a ____ and place into the cecum.  It  became evident that this would not accommodate a stapler because of the  induration in this location.  Therefore, I felt the best option would be to perform an ileocecectomy.  A small defect was made in the mesentery just next to the terminal ileum about 2 inches from the ileocecal valve with a hemostat.  The terminal ileum  was divided with a GIA-75 stapler with a blue load.  I then took down the mesentery, staying close to the bowel wall in sequential fashion using the Harmonic scalpel.  When I got to the vascular pedicle, I put a Kelly clamp around this.  At this point,  we had reached a normal section of the sort of distal cecum.  Dissection of the bowel was nice and soft.  I placed a 2-0 silk tie around the Clovis that had been placed on the vascular pedicle and there was excellent hemostasis.  I then divided the cecum  with another fire of a GIA-75 stapler with a blue load.  It should be noted that the cecum was just large.  There was a gap in the staple line that came slightly separated at the medial aspect of the staple line with some spillage of a small amount of  stool.  I then aligned the distal ileum and the proximal ascending colon in a side-to-side fashion.  A 3-0 silk suture was placed across each staple line to help anchor and orient the bowel.  Enterotomies were then made in the terminal ileum and proximal  ascending colon.  One fork of a GIA-75 stapler with a blue load was placed through each enterotomy.  The stapler was brought together and fired to create a common channel.  This created a functional anastomosis.  The common staple line was then lifted  up with Allises and I did incorporate the part of the colon staple line that had pulled apart.  The common defect was then closed with a single firing of a TA-60 stapler with a blue load.  I then imbricated the staple line with interrupted 3-0 silk  sutures.  A 3-0 silk suture was placed in the crotch of the anastomosis.  The anastomosis was  widely patent.  There appeared to be excellent blood flow as the tissue was very pink and well perfused.  I did use some of the mesentery to also imbricate some  of the transected mesentery to also oversew some of the exposed staple line.  Therefore, there was not a mesenteric  defect, essentially closed.  The abdomen was irrigated.  At this point, the wound protector was removed and we changed out gown, gloves,  drapes and instruments and got clean instruments.  The fascia was then closed with a #1 non-looped PDS, 1 from above, 1 from below.  I went back in laparoscopic to inspect the midline closure.  It was airtight.  There was nothing trapped within it.  I  then infiltrated Exparel around the midline incision as well as in bilateral lateral abdominal walls as a TAP block.  The remaining trocar was removed.  Pneumoperitoneum was released.  The subcutaneous tissue midline incision was irrigated with saline.   The skin was reapproximated with skin staples and a honeycomb dressing.  The trocar sites were closed with skin staples, 2 x 2 and bandage.  All needle, instrument and sponge counts were correct x2.  The patient tolerated the procedure well.  He was  taken to the recovery room in stable condition.  GN/NUANCE  D:05/08/2017 T:05/08/2017 JOB:[id number]

## 2017-05-08 NOTE — Transfer of Care (Signed)
Immediate Anesthesia Transfer of Care Note  Patient: Dean Weaver  Procedure(s) Performed: LAPAROSCOPIC APPENDECTOMY CONVERTED TO LAP ASSISTED ILEOCECTOMY ERAS PATHWAY (N/A )  Patient Location: PACU  Anesthesia Type:General  Level of Consciousness: awake, drowsy and patient cooperative  Airway & Oxygen Therapy: Patient Spontanous Breathing and Patient connected to face mask oxygen  Post-op Assessment: Report given to RN and Post -op Vital signs reviewed and stable  Post vital signs: Reviewed and stable  Last Vitals:  Vitals Value Taken Time  BP 170/99 05/08/2017 10:46 AM  Temp    Pulse 69 05/08/2017 10:48 AM  Resp 15 05/08/2017 10:48 AM  SpO2 100 % 05/08/2017 10:48 AM  Vitals shown include unvalidated device data.  Last Pain:  Vitals:   05/08/17 0600  TempSrc:   PainSc: 0-No pain         Complications: No apparent anesthesia complications

## 2017-05-08 NOTE — Anesthesia Preprocedure Evaluation (Addendum)
Anesthesia Evaluation  Patient identified by MRN, date of birth, ID band Patient awake    Reviewed: Allergy & Precautions, NPO status , Patient's Chart, lab work & pertinent test results  Airway Mallampati: III  TM Distance: >3 FB Neck ROM: Full    Dental  (+) Chipped,    Pulmonary neg pulmonary ROS,    Pulmonary exam normal breath sounds clear to auscultation       Cardiovascular hypertension, Pt. on medications Normal cardiovascular exam Rhythm:Regular Rate:Normal  ECG: SB, rate 50  Negative exercise tolerance test (12/2016)  Sees cardiologist (Dr. C)   Neuro/Psych PSYCHIATRIC DISORDERS Depression Bell's palsy  Neuromuscular disease    GI/Hepatic negative GI ROS, Neg liver ROS,   Endo/Other  negative endocrine ROS  Renal/GU negative Renal ROS     Musculoskeletal negative musculoskeletal ROS (+) Gout   Abdominal   Peds  Hematology HLD   Anesthesia Other Findings H/O ruptured appendicitis  Reproductive/Obstetrics                            Anesthesia Physical Anesthesia Plan  ASA: III  Anesthesia Plan: General   Post-op Pain Management:    Induction: Intravenous  PONV Risk Score and Plan: 2 and Ondansetron, Dexamethasone and Treatment may vary due to age or medical condition  Airway Management Planned: Oral ETT  Additional Equipment:   Intra-op Plan:   Post-operative Plan: Extubation in OR  Informed Consent: I have reviewed the patients History and Physical, chart, labs and discussed the procedure including the risks, benefits and alternatives for the proposed anesthesia with the patient or authorized representative who has indicated his/her understanding and acceptance.   Dental advisory given  Plan Discussed with: CRNA  Anesthesia Plan Comments:        Anesthesia Quick Evaluation

## 2017-05-08 NOTE — Anesthesia Procedure Notes (Signed)
Procedure Name: Intubation Date/Time: 05/08/2017 7:50 AM Performed by: West Pugh, CRNA Pre-anesthesia Checklist: Patient identified, Emergency Drugs available, Suction available, Patient being monitored and Timeout performed Patient Re-evaluated:Patient Re-evaluated prior to induction Oxygen Delivery Method: Circle system utilized Preoxygenation: Pre-oxygenation with 100% oxygen Induction Type: IV induction and Cricoid Pressure applied Ventilation: Mask ventilation without difficulty Laryngoscope Size: Mac and 4 Grade View: Grade I Tube type: Oral Tube size: 7.5 mm Number of attempts: 1 Airway Equipment and Method: Stylet Placement Confirmation: ETT inserted through vocal cords under direct vision,  positive ETCO2,  CO2 detector and breath sounds checked- equal and bilateral Secured at: 22 cm Tube secured with: Tape Dental Injury: Teeth and Oropharynx as per pre-operative assessment

## 2017-05-09 LAB — BASIC METABOLIC PANEL
ANION GAP: 9 (ref 5–15)
BUN: 19 mg/dL (ref 6–20)
CALCIUM: 8.6 mg/dL — AB (ref 8.9–10.3)
CO2: 24 mmol/L (ref 22–32)
Chloride: 107 mmol/L (ref 101–111)
Creatinine, Ser: 1.05 mg/dL (ref 0.61–1.24)
Glucose, Bld: 109 mg/dL — ABNORMAL HIGH (ref 65–99)
Potassium: 4.3 mmol/L (ref 3.5–5.1)
SODIUM: 140 mmol/L (ref 135–145)

## 2017-05-09 LAB — CBC
HCT: 34.2 % — ABNORMAL LOW (ref 39.0–52.0)
HEMOGLOBIN: 11.1 g/dL — AB (ref 13.0–17.0)
MCH: 29.2 pg (ref 26.0–34.0)
MCHC: 32.5 g/dL (ref 30.0–36.0)
MCV: 90 fL (ref 78.0–100.0)
PLATELETS: 229 10*3/uL (ref 150–400)
RBC: 3.8 MIL/uL — AB (ref 4.22–5.81)
RDW: 14 % (ref 11.5–15.5)
WBC: 14.7 10*3/uL — ABNORMAL HIGH (ref 4.0–10.5)

## 2017-05-09 MED ORDER — KETOROLAC TROMETHAMINE 15 MG/ML IJ SOLN
15.0000 mg | Freq: Three times a day (TID) | INTRAMUSCULAR | Status: DC
  Administered 2017-05-09 – 2017-05-10 (×3): 15 mg via INTRAVENOUS
  Filled 2017-05-09 (×3): qty 1

## 2017-05-09 MED ORDER — ACETAMINOPHEN 325 MG PO TABS
650.0000 mg | ORAL_TABLET | Freq: Four times a day (QID) | ORAL | Status: DC
  Administered 2017-05-09 – 2017-05-13 (×17): 650 mg via ORAL
  Filled 2017-05-09 (×18): qty 2

## 2017-05-09 MED ORDER — ENSURE SURGERY PO LIQD
237.0000 mL | Freq: Two times a day (BID) | ORAL | Status: DC
  Administered 2017-05-09 – 2017-05-13 (×5): 237 mL via ORAL
  Filled 2017-05-09 (×10): qty 237

## 2017-05-09 MED ORDER — ACETAMINOPHEN 325 MG PO TABS: 650.0000 mg | ORAL_TABLET | Freq: Four times a day (QID) | ORAL | Status: DC

## 2017-05-09 MED ORDER — DOCUSATE SODIUM 100 MG PO CAPS
100.0000 mg | ORAL_CAPSULE | Freq: Two times a day (BID) | ORAL | Status: DC
  Administered 2017-05-09 – 2017-05-13 (×9): 100 mg via ORAL
  Filled 2017-05-09 (×9): qty 1

## 2017-05-09 NOTE — Progress Notes (Signed)
1 Day Post-Op   Subjective/Chief Complaint: Moderate incisional discomfort No n/v. A little flatus; no burping/belching Tolerating water   Objective: Vital signs in last 24 hours: Temp:  [97.6 F (36.4 C)-98.6 F (37 C)] 98.3 F (36.8 C) (05/03 0601) Pulse Rate:  [60-82] 60 (05/03 0601) Resp:  [14-20] 18 (05/03 0601) BP: (103-166)/(61-93) 103/65 (05/03 0601) SpO2:  [98 %-100 %] 98 % (05/03 0601) Weight:  [83.9 kg (185 lb)-87.5 kg (192 lb 14.4 oz)] 87.5 kg (192 lb 14.4 oz) (05/03 0601) Last BM Date: 05/07/17  Intake/Output from previous day: 05/02 0701 - 05/03 0700 In: 2972.5 [P.O.:840; I.V.:2132.5] Out: 2525 [Urine:2475; Blood:50] Intake/Output this shift: No intake/output data recorded.  Alert, nontoxic cta Reg Soft, a little distension, approp TTP; some serosang drainage on honeycomb No edema, +SCDs  Lab Results:  Recent Labs    05/09/17 0509  WBC 14.7*  HGB 11.1*  HCT 34.2*  PLT 229   BMET Recent Labs    05/09/17 0509  NA 140  K 4.3  CL 107  CO2 24  GLUCOSE 109*  BUN 19  CREATININE 1.05  CALCIUM 8.6*   PT/INR No results for input(s): LABPROT, INR in the last 72 hours. ABG No results for input(s): PHART, HCO3 in the last 72 hours.  Invalid input(s): PCO2, PO2  Studies/Results: No results found.  Anti-infectives: Anti-infectives (From admission, onward)   Start     Dose/Rate Route Frequency Ordered Stop   05/08/17 2000  cefoTEtan (CEFOTAN) 2 g in sodium chloride 0.9 % 100 mL IVPB     2 g 200 mL/hr over 30 Minutes Intravenous Every 12 hours 05/08/17 1210 05/08/17 2130   05/08/17 0540  cefoTEtan in Dextrose 5% (CEFOTAN) IVPB 2 g     2 g Intravenous On call to O.R. 05/08/17 0540 05/08/17 1610      Assessment/Plan: H/o ruptured appendicitis HTN s/p Procedure(s): LAPAROSCOPIC APPENDECTOMY CONVERTED TO LAP ASSISTED ILEOCECTOMY ERAS PATHWAY (N/A)  Discussed intraop findings with pt and need to convert to open with bowel resection  Adv to  clear liquids Change toradol to scheduled Renew oral scheduled tylenol Cont chemical vte prophylaxis Ambulate Discussed criteria for dc  [name], MD, FACS General, Bariatric, & Minimally Invasive Surgery [facility], [place]   LOS: 1 day    [name] 05/09/2017

## 2017-05-10 LAB — CBC
HEMATOCRIT: 29.9 % — AB (ref 39.0–52.0)
Hemoglobin: 9.8 g/dL — ABNORMAL LOW (ref 13.0–17.0)
MCH: 30 pg (ref 26.0–34.0)
MCHC: 32.8 g/dL (ref 30.0–36.0)
MCV: 91.4 fL (ref 78.0–100.0)
Platelets: 182 10*3/uL (ref 150–400)
RBC: 3.27 MIL/uL — ABNORMAL LOW (ref 4.22–5.81)
RDW: 14.4 % (ref 11.5–15.5)
WBC: 10.7 10*3/uL — AB (ref 4.0–10.5)

## 2017-05-10 NOTE — Plan of Care (Signed)
  Problem: Pain Managment: Goal: General experience of comfort will improve Outcome: Progressing   

## 2017-05-10 NOTE — Progress Notes (Signed)
Pt called nurse to room and was noted bleeding at IV site.  Appeared as if pt had accidentally pulled out IV. Line was removed and area cleansed.  Dr. Maisie Fus notified and gave order to leave IV out for now.

## 2017-05-10 NOTE — Progress Notes (Signed)
2 Days Post-Op   Subjective/Chief Complaint: Moderate incisional discomfort No n/v.  Passing flatus, tolerating clears   Objective: Vital signs in last 24 hours: Temp:  [98.2 F (36.8 C)-99.1 F (37.3 C)] 99.1 F (37.3 C) (05/04 0510) Pulse Rate:  [53-63] 63 (05/04 0510) Resp:  [16-18] 18 (05/04 0510) BP: (109-129)/(66-75) 109/66 (05/04 0510) SpO2:  [96 %-100 %] 96 % (05/04 0510) Weight:  [89.3 kg (196 lb 13.9 oz)] 89.3 kg (196 lb 13.9 oz) (05/04 0510) Last BM Date: 05/07/17  Intake/Output from previous day: 05/03 0701 - 05/04 0700 In: 2515 [P.O.:540; I.V.:1975] Out: 250 [Urine:250] Intake/Output this shift: No intake/output data recorded.  Alert, nontoxic cta Reg Soft, a little distension, approp TTP; some serosang drainage on honeycomb No edema, +SCDs  Lab Results:  Recent Labs    05/09/17 0509 05/10/17 0500  WBC 14.7* 10.7*  HGB 11.1* 9.8*  HCT 34.2* 29.9*  PLT 229 182   BMET Recent Labs    05/09/17 0509  NA 140  K 4.3  CL 107  CO2 24  GLUCOSE 109*  BUN 19  CREATININE 1.05  CALCIUM 8.6*   PT/INR No results for input(s): LABPROT, INR in the last 72 hours. ABG No results for input(s): PHART, HCO3 in the last 72 hours.  Invalid input(s): PCO2, PO2  Studies/Results: No results found.  Anti-infectives: Anti-infectives (From admission, onward)   Start     Dose/Rate Route Frequency Ordered Stop   05/08/17 2000  cefoTEtan (CEFOTAN) 2 g in sodium chloride 0.9 % 100 mL IVPB     2 g 200 mL/hr over 30 Minutes Intravenous Every 12 hours 05/08/17 1210 05/08/17 2130   05/08/17 0540  cefoTEtan in Dextrose 5% (CEFOTAN) IVPB 2 g     2 g Intravenous On call to O.R. 05/08/17 0540 05/08/17 1610      Assessment/Plan: H/o ruptured appendicitis HTN s/p Procedure(s): LAPAROSCOPIC APPENDECTOMY CONVERTED TO LAP ASSISTED ILEOCECTOMY ERAS PATHWAY (N/A)  Discussed intraop findings with pt and need to convert to open with bowel resection  Adv diet as  tolerated Cont PO pain regimen   LOS: 2 days    Darina Hartwell C. 05/10/2017

## 2017-05-11 NOTE — Progress Notes (Signed)
3 Days Post-Op   Subjective/Chief Complaint: Moderate incisional discomfort No n/v.  Passing flatus, having more bloating and pain   Objective: Vital signs in last 24 hours: Temp:  [98.6 F (37 C)-100.2 F (37.9 C)] 98.6 F (37 C) (05/05 0550) Pulse Rate:  [64-79] 64 (05/05 0550) Resp:  [17-18] 18 (05/05 0550) BP: (118-145)/(68-81) 145/81 (05/05 0550) SpO2:  [94 %-97 %] 97 % (05/05 0550) Weight:  [86.1 kg (189 lb 12.8 oz)] 86.1 kg (189 lb 12.8 oz) (05/05 0622) Last BM Date: 05/07/17  Intake/Output from previous day: 05/04 0701 - 05/05 0700 In: 1336.7 [P.O.:780; I.V.:556.7] Out: -  Intake/Output this shift: No intake/output data recorded.  Alert, nontoxic cta Reg Soft, a little distension, approp TTP; some serosang drainage on honeycomb No edema, +SCDs  Lab Results:  Recent Labs    05/09/17 0509 05/10/17 0500  WBC 14.7* 10.7*  HGB 11.1* 9.8*  HCT 34.2* 29.9*  PLT 229 182   BMET Recent Labs    05/09/17 0509  NA 140  K 4.3  CL 107  CO2 24  GLUCOSE 109*  BUN 19  CREATININE 1.05  CALCIUM 8.6*   PT/INR No results for input(s): LABPROT, INR in the last 72 hours. ABG No results for input(s): PHART, HCO3 in the last 72 hours.  Invalid input(s): PCO2, PO2  Studies/Results: No results found.  Anti-infectives: Anti-infectives (From admission, onward)   Start     Dose/Rate Route Frequency Ordered Stop   05/08/17 2000  cefoTEtan (CEFOTAN) 2 g in sodium chloride 0.9 % 100 mL IVPB     2 g 200 mL/hr over 30 Minutes Intravenous Every 12 hours 05/08/17 1210 05/08/17 2130   05/08/17 0540  cefoTEtan in Dextrose 5% (CEFOTAN) IVPB 2 g     2 g Intravenous On call to O.R. 05/08/17 0540 05/08/17 [id number]      Assessment/Plan: H/o ruptured appendicitis HTN s/p Procedure(s): LAPAROSCOPIC APPENDECTOMY CONVERTED TO LAP ASSISTED ILEOCECTOMY ERAS PATHWAY (N/A)  Full liquids until BM Cont PO pain regimen Will try heating pad for incisional pain  LOS: 3 days     Sena Clouatre C. 05/11/2017

## 2017-05-12 LAB — CBC
HCT: 34.2 % — ABNORMAL LOW (ref 39.0–52.0)
Hemoglobin: 11.3 g/dL — ABNORMAL LOW (ref 13.0–17.0)
MCH: 29.8 pg (ref 26.0–34.0)
MCHC: 33 g/dL (ref 30.0–36.0)
MCV: 90.2 fL (ref 78.0–100.0)
PLATELETS: 273 10*3/uL (ref 150–400)
RBC: 3.79 MIL/uL — ABNORMAL LOW (ref 4.22–5.81)
RDW: 13.7 % (ref 11.5–15.5)
WBC: 9.9 10*3/uL (ref 4.0–10.5)

## 2017-05-12 LAB — BASIC METABOLIC PANEL
Anion gap: 12 (ref 5–15)
BUN: 11 mg/dL (ref 6–20)
CALCIUM: 9.3 mg/dL (ref 8.9–10.3)
CO2: 27 mmol/L (ref 22–32)
CREATININE: 1.13 mg/dL (ref 0.61–1.24)
Chloride: 101 mmol/L (ref 101–111)
GFR calc Af Amer: >60 mL/min (ref >60)
GFR calc non Af Amer: >60 mL/min (ref >60)
Glucose, Bld: 106 mg/dL — ABNORMAL HIGH (ref 65–99)
Potassium: 3.9 mmol/L (ref 3.5–5.1)
SODIUM: 140 mmol/L (ref 135–145)

## 2017-05-12 MED ORDER — MAGNESIUM HYDROXIDE 400 MG/5ML PO SUSP
15.0000 mL | Freq: Once | ORAL | Status: AC
  Administered 2017-05-12: 15 mL via ORAL
  Filled 2017-05-12: qty 30

## 2017-05-12 MED ORDER — METOCLOPRAMIDE HCL 5 MG PO TABS
5.0000 mg | ORAL_TABLET | Freq: Three times a day (TID) | ORAL | Status: DC
  Administered 2017-05-12 – 2017-05-13 (×4): 5 mg via ORAL
  Filled 2017-05-12 (×4): qty 1

## 2017-05-12 NOTE — Plan of Care (Signed)
Plan of care discussed.   

## 2017-05-13 MED ORDER — OXYCODONE-ACETAMINOPHEN 5-325 MG PO TABS: 1.0000 | ORAL_TABLET | Freq: Four times a day (QID) | ORAL | 0 refills | Status: DC | PRN

## 2017-05-13 NOTE — Evaluation (Signed)
Physical Therapy Evaluation Patient Details Name: Dean Weaver MRN: [id number] DOB: Feb 24, 1955 Today's Date: 05/13/2017   History of Present Illness  The patient is a 62 year old male who presents with appendicitis. He comes in for follow-up after being initially met in the hospital on March 26 with acute appendicitis with rupture.  He was discharged on the 31st.  When he came in he had ruptured appendicitis with extensive inflammation the right lower quadrant and an appendicolith.  We initially started with medical management and he was able to follow that algorithm.  He was continued on IV antibiotics for most of his hospital stay and converted to oral antibiotics on discharge.  He is accompanied by his wife today.  He states that he feels a lot better than he did while he was in the hospital.  He is still not 100%.  He denies any fever or chills.  He reports some night sweats.  He reports daily bowel movements.  He states that his appetite is still not 100% but he is eating and drinking throughout the day.  He finished his antibiotics last night.  He also endorses a little bit of bloating.  The right lower quadrant pain that he had in the hospital has completely resolved.  He has been monitoring his labs because he is access to phlebotomy.  He reports that his CBC has normalized.  He denies any melena or hematochezia.  He denies any dysuria.  He has chronic her being and belching. LAPAROSCOPIC APPENDECTOMY CONVERTED TO LAP ASSISTED ILEOCECTOMY ERAS PATHWAY      Clinical Impression  Patient independent in all mobility (bed, transfers, ambulation and stairs). Patient did c/o left posterior brachium pain making it difficult for him to push up out of bed on that side. PT recommended he consult with his family physician. Patient evaluated by Physical Therapy with no further acute PT needs identified. All education has been completed and the patient has no further questions. See below for any follow-up  Physical Therapy or equipment needs. PT is signing off. Thank you for this referral.     Follow Up Recommendations No PT follow up    Equipment Recommendations  None recommended by PT    Recommendations for Other Services       Precautions / Restrictions Precautions Precautions: None Restrictions Weight Bearing Restrictions: No      Mobility  Bed Mobility Overal bed mobility: Independent                Transfers Overall transfer level: Independent Equipment used: None                Ambulation/Gait Ambulation/Gait assistance: Independent Ambulation Distance (Feet): 450 Feet Assistive device: None   Gait velocity: minimally decreased      Stairs Stairs: Yes Stairs assistance: Independent Stair Management: One rail Right;Alternating pattern;Forwards Number of Stairs: 12    Wheelchair Mobility    Modified Rankin (Stroke Patients Only)       Balance Overall balance assessment: Independent                                           Pertinent Vitals/Pain Pain Assessment: 0-10 Pain Score: 5  Pain Location: abdomen incision Pain Descriptors / Indicators: Grimacing Pain Intervention(s): Monitored during session    Home Living Family/patient expects to be discharged to:: Private residence Living Arrangements: Spouse/significant other Available  Help at Discharge: Family Type of Home: House       Home Layout: One level Home Equipment: Grab bars - toilet;Grab bars - tub/shower;Hand held shower head;Shower seat - built in      Prior Function Level of Independence: Independent               Journalist, newspaper        Extremity/Trunk Assessment   Upper Extremity Assessment Upper Extremity Assessment: Overall WFL for tasks assessed    Lower Extremity Assessment Lower Extremity Assessment: Overall WFL for tasks assessed       Communication   Communication: No difficulties  Cognition Arousal/Alertness:  Awake/alert Behavior During Therapy: WFL for tasks assessed/performed Overall Cognitive Status: Within Functional Limits for tasks assessed                                        General Comments      Exercises     Assessment/Plan    PT Assessment Patent does not need any further PT services  PT Problem List         PT Treatment Interventions      PT Goals (Current goals can be found in the Care Plan section)  Acute Rehab PT Goals Patient Stated Goal: feel better and go home. PT Goal Formulation: With patient Time For Goal Achievement: 05/20/17    Frequency     Barriers to discharge        Co-evaluation               AM-PAC PT "6 Clicks" Daily Activity  Outcome Measure Difficulty turning over in bed (including adjusting bedclothes, sheets and blankets)?: None Difficulty moving from lying on back to sitting on the side of the bed? : A Little Difficulty sitting down on and standing up from a chair with arms (e.g., wheelchair, bedside commode, etc,.)?: None Help needed moving to and from a bed to chair (including a wheelchair)?: None Help needed walking in hospital room?: None Help needed climbing 3-5 steps with a railing? : None 6 Click Score: 23    End of Session   Activity Tolerance: Patient tolerated treatment well Patient left: in chair;with call bell/phone within reach Nurse Communication: Mobility status PT Visit Diagnosis: Difficulty in walking, not elsewhere classified (R26.2)    Time: 1610-9604 PT Time Calculation (min) (ACUTE ONLY): 15 min   Charges:   PT Evaluation $PT Eval Low Complexity: 1 Low PT Treatments $Gait Training: 8-22 mins   PT G Codes:        Senta Kantor D. Hartnett-Rands, MS, PT [facility] #[id number] 05/13/2017, 12:14 PM

## 2017-05-13 NOTE — Progress Notes (Signed)
5 Days Post-Op   Subjective/Chief Complaint: Feeling better. Passing flatus   Objective: Vital signs in last 24 hours: Temp:  [98.4 F (36.9 C)-98.8 F (37.1 C)] 98.4 F (36.9 C) (05/07 0616) Pulse Rate:  [52-57] 52 (05/07 0616) Resp:  [17-18] 18 (05/07 0616) BP: (137-145)/(80-83) 137/80 (05/07 0616) SpO2:  [96 %-97 %] 96 % (05/07 0616) Weight:  [81.6 kg (179 lb 14.3 oz)] 81.6 kg (179 lb 14.3 oz) (05/07 0500) Last BM Date: 05/07/17  Intake/Output from previous day: 05/06 0701 - 05/07 0700 In: 675 [P.O.:660; I.V.:15] Out: -  Intake/Output this shift: Total I/O In: 360 [P.O.:360] Out: -   General appearance: alert and cooperative Resp: clear to auscultation bilaterally Cardio: regular rate and rhythm GI: soft, mild tenderness. good bs. incision looks good  Lab Results:  Recent Labs    05/12/17 1058  WBC 9.9  HGB 11.3*  HCT 34.2*  PLT 273   BMET Recent Labs    05/12/17 1058  NA 140  K 3.9  CL 101  CO2 27  GLUCOSE 106*  BUN 11  CREATININE 1.13  CALCIUM 9.3   PT/INR No results for input(s): LABPROT, INR in the last 72 hours. ABG No results for input(s): PHART, HCO3 in the last 72 hours.  Invalid input(s): PCO2, PO2  Studies/Results: No results found.  Anti-infectives: Anti-infectives (From admission, onward)   Start     Dose/Rate Route Frequency Ordered Stop   05/08/17 2000  cefoTEtan (CEFOTAN) 2 g in sodium chloride 0.9 % 100 mL IVPB     2 g 200 mL/hr over 30 Minutes Intravenous Every 12 hours 05/08/17 1210 05/08/17 2130   05/08/17 0540  cefoTEtan in Dextrose 5% (CEFOTAN) IVPB 2 g     2 g Intravenous On call to O.R. 05/08/17 0540 05/08/17 0981      Assessment/Plan: s/p Procedure(s): LAPAROSCOPIC APPENDECTOMY CONVERTED TO LAP ASSISTED ILEOCECTOMY ERAS PATHWAY (N/A) Advance diet Discharge  LOS: 5 days    TOTH III,Sriansh S 05/13/2017

## 2017-05-13 NOTE — Progress Notes (Signed)
Pt was discharged home today. Instructions were reviewed with patient, and questions were answered. Pt was taken to main entrance via wheelchair by NT.  

## 2017-05-20 NOTE — Discharge Summary (Signed)
Physician Discharge Summary  Cletus Paris Hollie Salk WLN:[id number] DOB: August 10, 1955 DOA: 05/08/2017  PCP: Robyn Haber, MD  Admit date: 05/08/2017 Discharge date: 05/13/2017  Recommendations for Outpatient Follow-up:    Follow-up Information    Greer Pickerel, MD Follow up in 1 week(s).   Specialty:  General Surgery Contact information: [address] [address] [phone]          Discharge Diagnoses:  1. Perforated appendictis  Surgical Procedure: lap appy converted to lap assisted ileocecectomy   Discharge Condition: good Disposition: home  Diet recommendation:  Soft diet  Filed Weights   05/12/17 0452 05/12/17 0715 05/13/17 0500  Weight: 82.5 kg (181 lb 12.8 oz) 84.9 kg (187 lb 3.2 oz) 81.6 kg (179 lb 14.3 oz)    History of present illness:  The patient is a 62 year old male who presents with appendicitis. He comes in for follow-up after being initially met in the hospital on March 26 with acute appendicitis with rupture. He was discharged on the 31st. When he came in he had ruptured appendicitis with extensive inflammation the right lower quadrant and an appendicolith. We initially started with medical management and he was able to follow that algorithm. He was continued on IV antibiotics for most of his hospital stay and converted to oral antibiotics on discharge. He is accompanied by his wife today. He states that he feels a lot better than he did while he was in the hospital. He is still not 100%. He denies any fever or chills. He reports some night sweats. He reports daily bowel movements. He states that his appetite is still not 100% but he is eating and drinking throughout the day. He finished his antibiotics last night. He also endorses a little bit of bloating. The right lower quadrant pain that he had in the hospital has completely resolved. He has been monitoring his labs because he is access to phlebotomy. He reports that his CBC has  normalized. He denies any melena or hematochezia. He denies any dysuria. He has chronic her being and belching. They are planning to get a [place] in early june  Hospital Course:  He came in for interval appendectomy.  Intraoperatively it was hard to identify his appendix due to surrounding adhesions as well as a loop of small bowel densely adhered to the medial portion of the cecum.  Therefore I converted to open.  His appendix was ruptured and the base was really not intact so therefore I decided to proceed with ileocecectomy.  Dr. Ninfa Linden assisted.  Postoperatively he was admitted to the floor.  He had a slow return of bowel function.  He had flatus.  He was started on clears and his diet was gradually advanced.  He was having large amounts of flatus, tolerating liquids afebrile, stable vital signs, minimal discomfort, and he was felt stable for discharge.  Dr. Marlou Starks help me care for him postoperatively.  He felt that he was stable for discharge.  He received discharge instructions.   Discharge Instructions  Discharge Instructions    Call MD for:  difficulty breathing, headache or visual disturbances   Complete by:  As directed    Call MD for:  extreme fatigue   Complete by:  As directed    Call MD for:  hives   Complete by:  As directed    Call MD for:  persistant dizziness or light-headedness   Complete by:  As directed    Call MD for:  persistant  nausea and vomiting   Complete by:  As directed    Call MD for:  redness, tenderness, or signs of infection (pain, swelling, redness, odor or green/yellow discharge around incision site)   Complete by:  As directed    Call MD for:  severe uncontrolled pain   Complete by:  As directed    Call MD for:  temperature >100.4   Complete by:  As directed    Diet - low sodium heart healthy   Complete by:  As directed    Discharge instructions   Complete by:  As directed    May shower. No heavy lifting. Diet as tolerated. Use colace and  miralax for constipation   Increase activity slowly   Complete by:  As directed    No wound care   Complete by:  As directed      Allergies as of 05/13/2017   No Known Allergies     Medication List    TAKE these medications   allopurinol 100 MG tablet Commonly known as:  ZYLOPRIM Take 1 tablet (100 mg total) by mouth daily.   FLUoxetine 20 MG tablet Commonly known as:  PROZAC Take 1 tablet (20 mg total) by mouth daily.   lisinopril 40 MG tablet Commonly known as:  PRINIVIL,ZESTRIL Take 1 tablet (40 mg total) by mouth daily.   multivitamin tablet Take 1 tablet by mouth daily.   naproxen sodium 220 MG tablet Commonly known as:  ALEVE Take 440 mg by mouth daily.   ondansetron 4 MG tablet Commonly known as:  ZOFRAN Take 1 tablet (4 mg total) by mouth every 6 (six) hours as needed for nausea.   oxyCODONE-acetaminophen 5-325 MG tablet Commonly known as:  PERCOCET/ROXICET Take 1-2 tablets by mouth every 6 (six) hours as needed for severe pain.   rosuvastatin 20 MG tablet Commonly known as:  CRESTOR Take 20 mg by mouth daily.      Follow-up Information    Greer Pickerel, MD Follow up in 1 week(s).   Specialty:  General Surgery Contact information: [place] [address] [phone]            The results of significant diagnostics from this hospitalization (including imaging, microbiology, ancillary and laboratory) are listed below for reference.    Significant Diagnostic Studies: No results found.  Microbiology: No results found for this or any previous visit (from the past 240 hour(s)).   Labs: Basic Metabolic Panel: No results for input(s): NA, K, CL, CO2, GLUCOSE, BUN, CREATININE, CALCIUM, MG, PHOS in the last 168 hours. Liver Function Tests: No results for input(s): AST, ALT, ALKPHOS, BILITOT, PROT, ALBUMIN in the last 168 hours. No results for input(s): LIPASE, AMYLASE in the last 168 hours. No results for input(s): AMMONIA  in the last 168 hours. CBC: No results for input(s): WBC, NEUTROABS, HGB, HCT, MCV, PLT in the last 168 hours. Cardiac Enzymes: No results for input(s): CKTOTAL, CKMB, CKMBINDEX, TROPONINI in the last 168 hours. BNP: BNP (last 3 results) No results for input(s): BNP in the last 8760 hours.  ProBNP (last 3 results) No results for input(s): PROBNP in the last 8760 hours.  CBG: No results for input(s): GLUCAP in the last 168 hours.  Active Problems:   Ruptured appendicitis   Time coordinating discharge: 15 min  Signed:  Gayland Curry, MD [facility] Surgery, [place] [phone] 05/20/2017, 10:26 AM

## 2017-05-22 ENCOUNTER — Ambulatory Visit (HOSPITAL_COMMUNITY)
Admission: EM | Admit: 2017-05-22 | Discharge: 2017-05-22 | Disposition: A | Discharge status: Home / Self Care | Payer: PRIVATE HEALTH INSURANCE | Attending: Family Medicine | Admitting: Family Medicine

## 2017-05-22 DIAGNOSIS — M75102 Unspecified rotator cuff tear or rupture of left shoulder, not specified as traumatic: Secondary | ICD-10-CM | POA: Diagnosis not present

## 2017-05-22 MED ORDER — BUPIVACAINE HCL (PF) 0.5 % IJ SOLN
INTRAMUSCULAR | Status: AC
  Filled 2017-05-22: qty 10

## 2017-05-22 MED ORDER — METHYLPREDNISOLONE ACETATE 40 MG/ML IJ SUSP
INTRAMUSCULAR | Status: AC
  Filled 2017-05-22: qty 1

## 2017-05-22 NOTE — ED Provider Notes (Signed)
[facility]   [id number] 05/22/17 Arrival Time: 1556   SUBJECTIVE:  Dean Weaver is a 62 y.o. male who presents to the urgent care with complaint of persistent left shoulder pain, presumably after lifting weights over a year ago.  Pain with rotation of shoulder.     Past Medical History:  Diagnosis Date  . Bell's palsy    left side;   . Bradycardia    non symptomatic; has been evaluated by cardio Dr Royann Shivers . see epic encounter 01-01-17  . Dyspnea    per cardiologist note in epic  ; patient denies any recently   . Gout   . Heart murmur    patient self reports that his cardio Dr told hime he had a slight murmur but states " i dont experience anything with it though "  . History of appendicitis    reports he was told it ruptured   . Hyperlipidemia   . Hypertension    Family History  Problem Relation Age of Onset  . Cancer Mother   . Hyperlipidemia Father   . Hypertension Father   . Hyperlipidemia Sister   . Heart disease Maternal Grandfather   . Heart attack Maternal Grandfather   . Emphysema Paternal Grandmother   . Heart disease Paternal Grandfather    Social History   Socioeconomic History  . Marital status: Married    Spouse name: Not on file  . Number of children: 2  . Years of education: Not on file  . Highest education level: Not on file  Occupational History  . Not on file  Social Needs  . Financial resource strain: Not on file  . Food insecurity:    Worry: Not on file    Inability: Not on file  . Transportation needs:    Medical: Not on file    Non-medical: Not on file  Tobacco Use  . Smoking status: Never Smoker  . Smokeless tobacco: Former Engineer, water and Sexual Activity  . Alcohol use: Not Currently    Alcohol/week: 1.8 oz    Types: 3 Standard drinks or equivalent per week    Comment: no drink in 6 weeks   . Drug use: No  . Sexual activity: Not on file  Lifestyle  . Physical activity:    Days per week: Not on file   Minutes per session: Not on file  . Stress: Not on file  Relationships  . Social connections:    Talks on phone: Not on file    Gets together: Not on file    Attends religious service: Not on file    Active member of club or organization: Not on file    Attends meetings of clubs or organizations: Not on file    Relationship status: Not on file  . Intimate partner violence:    Fear of current or ex partner: Not on file    Emotionally abused: Not on file    Physically abused: Not on file    Forced sexual activity: Not on file  Other Topics Concern  . Not on file  Social History Narrative  . Not on file   No outpatient medications have been marked as taking for the 05/22/17 encounter [facility] Encounter).   No Known Allergies    ROS: As per HPI, remainder of ROS negative.   OBJECTIVE:   There were no vitals filed for this visit.   General appearance: alert; no distress Eyes: PERRL; EOMI; conjunctiva normal HENT: normocephalic; atraumatic; Neck:  supple Back: no CVA tenderness Extremities: no cyanosis or edema; symmetrical with no gross deformities; Pain with resisted external rotation of the left shoulder or abduction over 90 degrees Skin: warm and dry Neurologic: normal gait; grossly normal Psychological: alert and cooperative; normal mood and affect      Labs:  Results for orders placed or performed during the hospital encounter of 05/08/17  Basic metabolic panel  Result Value Ref Range   Sodium 140 135 - 145 mmol/L   Potassium 4.3 3.5 - 5.1 mmol/L   Chloride 107 101 - 111 mmol/L   CO2 24 22 - 32 mmol/L   Glucose, Bld 109 (H) 65 - 99 mg/dL   BUN 19 6 - 20 mg/dL   Creatinine, Ser 1.61 0.61 - 1.24 mg/dL   Calcium 8.6 (L) 8.9 - 10.3 mg/dL   GFR calc non Af Amer >60 >60 mL/min   GFR calc Af Amer >60 >60 mL/min   Anion gap 9 5 - 15  CBC  Result Value Ref Range   WBC 14.7 (H) 4.0 - 10.5 K/uL   RBC 3.80 (L) 4.22 - 5.81 MIL/uL   Hemoglobin 11.1 (L) 13.0 -  17.0 g/dL   HCT 09.6 (L) 04.5 - 40.9 %   MCV 90.0 78.0 - 100.0 fL   MCH 29.2 26.0 - 34.0 pg   MCHC 32.5 30.0 - 36.0 g/dL   RDW 81.1 91.4 - 78.2 %   Platelets 229 150 - 400 K/uL  CBC  Result Value Ref Range   WBC 10.7 (H) 4.0 - 10.5 K/uL   RBC 3.27 (L) 4.22 - 5.81 MIL/uL   Hemoglobin 9.8 (L) 13.0 - 17.0 g/dL   HCT 95.6 (L) 21.3 - 08.6 %   MCV 91.4 78.0 - 100.0 fL   MCH 30.0 26.0 - 34.0 pg   MCHC 32.8 30.0 - 36.0 g/dL   RDW 57.8 46.9 - 62.9 %   Platelets 182 150 - 400 K/uL  CBC  Result Value Ref Range   WBC 9.9 4.0 - 10.5 K/uL   RBC 3.79 (L) 4.22 - 5.81 MIL/uL   Hemoglobin 11.3 (L) 13.0 - 17.0 g/dL   HCT 52.8 (L) 41.3 - 24.4 %   MCV 90.2 78.0 - 100.0 fL   MCH 29.8 26.0 - 34.0 pg   MCHC 33.0 30.0 - 36.0 g/dL   RDW 01.0 27.2 - 53.6 %   Platelets 273 150 - 400 K/uL  Basic metabolic panel  Result Value Ref Range   Sodium 140 135 - 145 mmol/L   Potassium 3.9 3.5 - 5.1 mmol/L   Chloride 101 101 - 111 mmol/L   CO2 27 22 - 32 mmol/L   Glucose, Bld 106 (H) 65 - 99 mg/dL   BUN 11 6 - 20 mg/dL   Creatinine, Ser 6.44 0.61 - 1.24 mg/dL   Calcium 9.3 8.9 - 03.4 mg/dL   GFR calc non Af Amer >60 >60 mL/min   GFR calc Af Amer >60 >60 mL/min   Anion gap 12 5 - 15    Labs Reviewed - No data to display  No results found.     ASSESSMENT & PLAN:  1. Rotator cuff syndrome of left shoulder     No orders of the defined types were placed in this encounter.   Reviewed expectations re: course of current medical issues. Questions answered. Outlined signs and symptoms indicating need for more acute intervention. Patient verbalized understanding. After Visit Summary given.    Procedures: 1 cc  of Marcaine 0.5% and 1 cc of Depo-Medrol 40 mg were injected posteriorly into the left shoulder joint line      Elvina Sidle, MD 05/22/17 1620

## 2017-05-26 ENCOUNTER — Other Ambulatory Visit: Payer: Self-pay | Admitting: Cardiovascular Disease

## 2017-05-26 MED ORDER — LISINOPRIL 40 MG PO TABS: 40.0000 mg | ORAL_TABLET | Freq: Every day | ORAL | 0 refills | Status: DC

## 2017-05-26 MED ORDER — ROSUVASTATIN CALCIUM 20 MG PO TABS: 20.0000 mg | ORAL_TABLET | Freq: Every day | ORAL | 0 refills | Status: DC

## 2017-05-26 NOTE — Telephone Encounter (Signed)
New Message:       *STAT* If patient is at the pharmacy, call can be transferred to refill team.   1. Which medications need to be refilled? (please list name of each medication and dose if known)  allopurinol (ZYLOPRIM) 100 MG tablet  lisinopril (PRINIVIL,ZESTRIL) 40 MG tablet  rosuvastatin (CRESTOR) 20 MG tablet  2. Which pharmacy/location (including street and city if local pharmacy) is medication to be sent to?[facility] [address] [place] - [address]  3. Do they need a 30 day or 90 day supply? 90

## 2017-05-26 NOTE — Telephone Encounter (Signed)
Please defer to PCP

## 2017-08-22 ENCOUNTER — Other Ambulatory Visit: Payer: Self-pay | Admitting: Cardiovascular Disease

## 2018-02-13 ENCOUNTER — Ambulatory Visit (INDEPENDENT_AMBULATORY_CARE_PROVIDER_SITE_OTHER): Payer: PRIVATE HEALTH INSURANCE | Admitting: Cardiovascular Disease

## 2018-02-13 ENCOUNTER — Encounter: Payer: Self-pay | Admitting: Cardiovascular Disease

## 2018-02-13 VITALS — BP 134/76 | HR 96 | Ht 68.0 in | Wt 197.4 lb

## 2018-02-13 DIAGNOSIS — Z8739 Personal history of other diseases of the musculoskeletal system and connective tissue: Secondary | ICD-10-CM

## 2018-02-13 DIAGNOSIS — I700 Atherosclerosis of aorta: Secondary | ICD-10-CM | POA: Diagnosis not present

## 2018-02-13 DIAGNOSIS — E7801 Familial hypercholesterolemia: Secondary | ICD-10-CM | POA: Diagnosis not present

## 2018-02-13 DIAGNOSIS — I10 Essential (primary) hypertension: Secondary | ICD-10-CM | POA: Diagnosis not present

## 2018-02-13 NOTE — Progress Notes (Signed)
Cardiology Office Note:    Date:  02/13/2018   ID:  Dean Weaver, DOB 05-May-1955, MRN [id number] PCP:  Dean NobleGates, Robert, MD  Cardiologist:  Dean FairMihai Tyanna Hach, MD    Referring MD: No ref. provider found   No chief complaint on file.   History of Present Illness:    Dean Weaver is a 63 y.o. male with a hx of hypercholesterolemia (probably familial), systemic hypertension, gout, left-sided Bell's palsy and bradycardia who is here for routine cardiac follow-up.  He does not have any cardiovascular complaints.  He has recovered well from his appendectomy, even though he needed open laparotomy for resection.  Not had any gout attacks.  Sizes regularly.  He walks his dog for at least 3 miles 7 days a week and then also goes to the gym 4 times a week.  The patient specifically denies any chest pain at rest exertion, dyspnea at rest or with exertion, orthopnea, paroxysmal nocturnal dyspnea, syncope, palpitations, focal neurological deficits, intermittent claudication, lower extremity edema, unexplained weight gain, cough, hemoptysis or wheezing.  Previous evaluation for mild bradycardia did not identify evidence of ischemic heart disease or need for pacemaker.  Doing CT scanning for his appendicitis he did have evidence of aortic atherosclerosis without aneurysm formation or any visible large vessel stenosis.  His maternal grandfather died in his 3940s from heart disease, but his other relatives have not had premature heart problems.  Dean Weaver's ancestry is Ashkenazi Jewish. He is originally from [place], but has lived in [place] for over [place] years. 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  Past Medical History:  Diagnosis Date  . Bell's palsy    left side;   . Bradycardia    non symptomatic; has been evaluated by cardio Dr Dean Weaver . see epic encounter 01-01-17  . Dyspnea    per cardiologist note in epic  ; patient denies any recently   . Gout   . Heart murmur    patient self reports that his cardio Dr told hime he had a slight murmur but states " i dont experience anything with it though "  . History of appendicitis    reports he was told it ruptured   . Hyperlipidemia   . Hypertension     No past history of major surgery.  Current Medications: Current Meds  Medication Sig  . allopurinol (ZYLOPRIM) 100 MG tablet Take 1 tablet (100 mg total) by mouth daily.  . cholecalciferol (VITAMIN D3) 25 MCG (1000 UT) tablet Take 1,000 Units by mouth daily.  Marland Kitchen. FLUoxetine (PROZAC) 20 MG capsule Take 20 mg by mouth daily.  Marland Kitchen. lisinopril (PRINIVIL,ZESTRIL) 40 MG tablet TAKE ONE TABLET BY MOUTH DAILY  . Multiple Vitamin (MULTIVITAMIN) tablet Take 1 tablet by mouth daily.  . naproxen sodium (ALEVE) 220 MG tablet Take 440 mg by mouth daily as needed.  . rosuvastatin (CRESTOR) 20 MG tablet TAKE ONE TABLET BY MOUTH DAILY  . vitamin E (VITAMIN E) 400 UNIT capsule Take 400 Units by mouth daily.     Allergies:   Patient has no known allergies.   Social History   Socioeconomic History  . Marital status: Married    Spouse name: Not on file  . Number of children: 2  . Years of education: Not on file  . Highest education level: Not on file  Occupational History  . Not on file  Social Needs  . Financial resource strain: Not on file  . Food insecurity:  Worry: Not on file    Inability: Not on file  . Transportation needs:    Medical: Not on file    Non-medical: Not on file  Tobacco Use  . Smoking status: Never Smoker  . Smokeless tobacco: Former Engineer, waterUser  Substance and Sexual Activity  . Alcohol use: Not Currently    Alcohol/week: 3.0 standard drinks    Types: 3 Standard drinks or equivalent per week    Comment: no drink in 6 weeks   . Drug use: No  . Sexual activity: Not on file  Lifestyle  . Physical activity:    Days per week: Not on file    Minutes per session: Not on file  . Stress: Not on file  Relationships  . Social connections:    Talks on  phone: Not on file    Gets together: Not on file    Attends religious service: Not on file    Active member of club or organization: Not on file    Attends meetings of clubs or organizations: Not on file    Relationship status: Not on file  Other Topics Concern  . Not on file  Social History Narrative  . Not on file     Family History: The patient's family history includes Cancer in his mother; Emphysema in his paternal grandmother; Heart attack in his maternal grandfather; Heart disease in his maternal grandfather and paternal grandfather; Hyperlipidemia in his father and sister; Hypertension in his father. ROS:   Please see the history of present illness.    All other systems are reviewed and are negative EKGs/Labs/Other Studies Reviewed:   EKG:  EKG is ordered today.  01/01/2017 Showed sinus bradycardia, normal tracing  The following studies were reviewed today: Labs, Provided by the patient from February 06, 2018  Hemoglobin 14.9, creatinine 1.5, normal liver function tests, potassium 4.9 Total cholesterol 227, HDL 71, LDL 143, triglycerides 140 Glucose 95  Recent Lipid Panel    Component Value Date/Time   CHOL 193 09/29/2014 0833   TRIG 158 (H) 09/29/2014 0833   HDL 48 09/29/2014 0833   CHOLHDL 4.0 09/29/2014 0833   VLDL 32 (H) 09/29/2014 0833   LDLCALC 113 09/29/2014 0833  (on rosuva) Total cholesterol 271, triglycerides 154, HDL 65, LDL 192 (on simva)  Physical Exam:    VS:  BP 134/76   Pulse 96   Ht 5\' 8"  (1.727 m)   Wt 197 lb 6.4 oz (89.5 kg)   BMI 30.01 kg/m    Recheck BP 126/72 mmHg Wt Readings from Last 3 Encounters:  02/13/18 197 lb 6.4 oz (89.5 kg)  05/13/17 179 lb 14.3 oz (81.6 kg)  05/02/17 185 lb (83.9 kg)    General: Alert, oriented x3, no distress, Obese Head: no evidence of trauma, PERRL, EOMI, no exophtalmos or lid lag, no myxedema, no xanthelasma; normal ears, nose and oropharynx Neck: normal jugular venous pulsations and no hepatojugular  reflux; brisk carotid pulses without delay and no carotid bruits Chest: clear to auscultation, no signs of consolidation by percussion or palpation, normal fremitus, symmetrical and full respiratory excursions Cardiovascular: normal position and quality of the apical impulse, regular rhythm, normal first and second heart sounds, no murmurs, rubs or gallops Abdomen: no tenderness or distention, no masses by palpation, no abnormal pulsatility or arterial bruits, normal bowel sounds, no hepatosplenomegaly Extremities: no clubbing, cyanosis or edema; 2+ radial, ulnar and brachial pulses bilaterally; 2+ right femoral, posterior tibial and dorsalis pedis pulses; 2+ left femoral, posterior tibial  and dorsalis pedis pulses; no subclavian or femoral bruits Neurological: grossly nonfocal Psych: Normal mood and affect salis pedis pulses; no subclavian or femoral bruits Neurological: Mild left facial droop consistent with Bell's palsy Psych: Normal mood and affect    ASSESSMENT:    1. Heterozygous familial hypercholesterolemia   2. Essential hypertension   3. History of gout   4. Aortic atherosclerosis (HCC)    PLAN:    In order of problems listed above:  1. HLP: He had severely elevated LDL cholesterol consistent with familial heterozygous hypercholesterolemia with a reasonably good response to high-dose rosuvastatin.  He is not in target range for his LDL cholesterol, but he does not have good options for further lowering with pharmaceutical agents (that he could afford).  Discussed ezetimibe or Repatha, but until he gets better drug coverage in a year or 2 he does not want start any medications.  Although he is physically active he has gained weight and this may explain the slight worsening of his LDL cholesterol complaint to last year.  He is congratulated for his activity level but needs to curtail intake of saturated fats and carbohydrates.  When I discussed his diet with him he really does not seem  to be eating poorly, might be a question of more attention to portion sizes with dinner. 2. HTN: Blood pressure is well controlled on ACE inhibitor monotherapy.  Note that in the past he had bradycardia so we were avoiding beta-blockers or centrally acting calcium channel blockers.  His heart rate is uncharacteristically fast today. 3. Gout: Avoid thiazide diuretics.  No recent gout attacks. 4. Aortic atherosclerosis: Mild, reported on CT abdomen with no aneurysm seen   Medication Adjustments/Labs and Tests Ordered: Current medicines are reviewed at length with the patient today.  Concerns regarding medicines are outlined above.  No orders of the defined types were placed in this encounter.  No orders of the defined types were placed in this encounter.   Signed, Dean Fair, MD  02/13/2018 3:08 PM    [facility]  ADDENDUM:  CLINICAL DATA:  Right upper quadrant pain.  Fevers and chills.  EXAM: ULTRASOUND ABDOMEN LIMITED RIGHT UPPER QUADRANT  COMPARISON:  No prior.  FINDINGS: Gallbladder:  Gallbladder is contracted. Internal echoes suggesting sludge noted. Gallbladder wall thickness 3.1 mm. Positive Murphy sign. Questionable trace pericholecystic fluid.  Common bile duct:  Diameter: 2.5 mm  Liver:  Increased echogenicity consistent fatty infiltration and/or hepatocellular disease. Slight decrease in hepatic echogenicity in the region of the porta hepatis may represent focal fatty sparing. No definite mass. Slightly lobular contour. Cirrhosis cannot be completely excluded. Portal vein is patent on color Doppler imaging with normal direction of blood flow towards the liver.  IMPRESSION: 1. Contracted gallbladder. Mild prominence of the gallbladder wall at 3.1 mm. This could be from contracted state. Faint internal echoes noted within the gallbladder. Sludge may be present. Trace pericholecystic fluid noted. Positive Murphy sign.  Cholecystitis cannot be completely excluded. No biliary distention.  2. Increased echogenicity of the liver consistent with fatty infiltration and/or hepatocellular disease. Slight decrease in hepatic echogenicity in the region of the porta hepatis may represent focal fatty sparing. Liver has a slightly lobular contour. Cirrhosis cannot be completely excluded.  I do not have the results of his blood work which was done at an outside lab, but Bentzion told me that his WBC was up to 17 K.  He has had more fever and has decided to go to  the emergency room. I agree that is the right thing to do.

## 2018-02-13 NOTE — Patient Instructions (Signed)
Medication Instructions:  The current medical regimen is effective;  continue present plan and medications.  If you need a refill on your cardiac medications before your next appointment, please call your pharmacy.    Follow-Up: At [facility], you and your health needs are our priority.  As part of our continuing mission to provide you with exceptional heart care, we have created designated Provider Care Teams.  These Care Teams include your primary Cardiologist (physician) and Advanced Practice Providers (APPs -  Physician Assistants and Nurse Practitioners) who all work together to provide you with the care you need, when you need it. You will need a follow up appointment in 12 months.  Please call our office 2 months in advance to schedule this appointment.  You may see Mihai Croitoru, MD or one of the following Advanced Practice Providers on your designated Care Team: Hao Meng, PA-C . Angela Duke, PA-C    

## 2018-09-15 ENCOUNTER — Other Ambulatory Visit: Payer: Self-pay | Admitting: Cardiovascular Disease

## 2018-09-30 ENCOUNTER — Telehealth (HOSPITAL_COMMUNITY): Payer: Self-pay | Admitting: Family Medicine

## 2018-09-30 MED ORDER — FLUOXETINE HCL 20 MG PO CAPS: 20.0000 mg | ORAL_CAPSULE | Freq: Every day | ORAL | 3 refills | Status: AC

## 2018-09-30 NOTE — Telephone Encounter (Signed)
Doing well on current fluoxetine and requesting refill

## 2018-10-17 ENCOUNTER — Other Ambulatory Visit: Payer: Self-pay | Admitting: Cardiovascular Disease

## 2019-03-05 ENCOUNTER — Encounter: Payer: Self-pay | Admitting: Cardiovascular Disease

## 2019-03-05 ENCOUNTER — Other Ambulatory Visit: Payer: Self-pay

## 2019-03-05 ENCOUNTER — Ambulatory Visit (INDEPENDENT_AMBULATORY_CARE_PROVIDER_SITE_OTHER): Payer: PRIVATE HEALTH INSURANCE | Admitting: Cardiovascular Disease

## 2019-03-05 VITALS — BP 140/80 | HR 49 | Ht 68.0 in | Wt 201.0 lb

## 2019-03-05 DIAGNOSIS — I10 Essential (primary) hypertension: Secondary | ICD-10-CM | POA: Diagnosis not present

## 2019-03-05 DIAGNOSIS — I700 Atherosclerosis of aorta: Secondary | ICD-10-CM

## 2019-03-05 DIAGNOSIS — E669 Obesity, unspecified: Secondary | ICD-10-CM | POA: Diagnosis not present

## 2019-03-05 DIAGNOSIS — E7800 Pure hypercholesterolemia, unspecified: Secondary | ICD-10-CM | POA: Diagnosis not present

## 2019-03-05 DIAGNOSIS — Z8739 Personal history of other diseases of the musculoskeletal system and connective tissue: Secondary | ICD-10-CM

## 2019-03-05 NOTE — Progress Notes (Signed)
Cardiology Office Note:    Date:  03/06/2019   ID:  Dean Weaver, DOB 1955/08/23, MRN [id number] PCP:  Josetta Huddle, MD  Cardiologist:  Sanda Klein, MD    Referring MD: Josetta Huddle, MD   Chief Complaint  Patient presents with  . Hyperlipidemia    History of Present Illness:    Dean Weaver is a 64 y.o. male with a hx of hypercholesterolemia (probably familial), systemic hypertension, gout, left-sided Bell's palsy and chronic sinus bradycardia who is here for routine cardiac follow-up.  He is doing well.  He walks his [place] 4 miles a day 6 days a week.  He has not had any gout attacks.  He denies angina or dyspnea with activity.  He has not had claudication or leg edema or focal neurological events.  He bemoans the fact that he is gained about 10 pounds during the coronavirus pandemic.  He is now formally obese with a BMI over 30.  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  He does not describe true daytime hypersomnolence and does not have other symptoms to suggest depression.  Previous evaluation for mild bradycardia did not identify evidence of ischemic heart disease or need for pacemaker.  Abdominal CT shows some evidence of aortic atherosclerosis without aneurysm.  His maternal grandfather died in his 26s from heart disease, but his other relatives have not had premature heart problems.  Rielly's ancestry is Ashkenazi Jewish. He is originally from [place], but has lived in [place] for over 40 years. 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  Past Medical History:  Diagnosis Date  . Bell's palsy    left side;   . Bradycardia    non symptomatic; has been evaluated by cardio Dr Sallyanne Kuster . see epic encounter 01-01-17  . Dyspnea    per cardiologist note in epic  ; patient denies any recently   . Gout   .  Heart murmur    patient self reports that his cardio Dr told hime he had a slight murmur but states " i dont experience anything with it though "  . History of appendicitis    reports he was told it ruptured   . Hyperlipidemia   . Hypertension     Past surgical history: Appendectomy 2019  Current Medications: Current Meds  Medication Sig  . allopurinol (ZYLOPRIM) 100 MG tablet Take 1 tablet (100 mg total) by mouth daily.  . cholecalciferol (VITAMIN D3) 25 MCG (1000 UT) tablet Take 1,000 Units by mouth daily.  Marland Kitchen FLUoxetine (PROZAC) 20 MG capsule Take 1 capsule (20 mg total) by mouth daily.  Marland Kitchen lisinopril (ZESTRIL) 40 MG tablet TAKE ONE TABLET BY MOUTH DAILY  . Multiple Vitamin (MULTIVITAMIN) tablet Take 1 tablet by mouth daily.  . naproxen sodium (ALEVE) 220 MG tablet Take 440 mg by mouth daily as needed.  . rosuvastatin (CRESTOR) 20 MG tablet TAKE ONE TABLET BY MOUTH DAILY  . vitamin E (VITAMIN E) 400 UNIT capsule Take 400 Units by mouth daily.     Allergies:   Patient has no known allergies.   Social History   Socioeconomic History  . Marital status: Married    Spouse name: Not on file  . Number of children: 2  . Years of education: Not on file  . Highest education level:  Not on file  Occupational History  . Not on file  Tobacco Use  . Smoking status: Never Smoker  . Smokeless tobacco: Former Engineer, water and Sexual Activity  . Alcohol use: Not Currently    Alcohol/week: 3.0 standard drinks    Types: 3 Standard drinks or equivalent per week    Comment: no drink in 6 weeks   . Drug use: No  . Sexual activity: Not on file  Other Topics Concern  . Not on file  Social History Narrative  . Not on file   Social Determinants of Health   Financial Resource Strain:   . Difficulty of Paying Living Expenses: Not on file  Food Insecurity:   . Worried About Programme researcher, broadcasting/film/video in the Last Year: Not on file  . Ran Out of Food in the Last Year: Not on file  Transportation  Needs:   . Lack of Transportation (Medical): Not on file  . Lack of Transportation (Non-Medical): Not on file  Physical Activity:   . Days of Exercise per Week: Not on file  . Minutes of Exercise per Session: Not on file  Stress:   . Feeling of Stress : Not on file  Social Connections:   . Frequency of Communication with Friends and Family: Not on file  . Frequency of Social Gatherings with Friends and Family: Not on file  . Attends Religious Services: Not on file  . Active Member of Clubs or Organizations: Not on file  . Attends Banker Meetings: Not on file  . Marital Status: Not on file     Family History: The patient's family history includes Cancer in his mother; Emphysema in his paternal grandmother; Heart attack in his maternal grandfather; Heart disease in his maternal grandfather and paternal grandfather; Hyperlipidemia in his father and sister; Hypertension in his father. ROS:   Please see the history of present illness.    All other systems are reviewed and are negative.  EKGs/Labs/Other Studies Reviewed:   EKG:  EKG is ordered today.  It shows sinus bradycardia and left axis deviation, almost meeting criteria for left anterior fascicular block no repolarization abnormalities, QTC 406  The following studies were reviewed today: Labs, Provided by the patient from February 06, 2018  Hemoglobin 14.9, creatinine 1.5, normal liver function tests, potassium 4.9 Total cholesterol 227, HDL 71, LDL 143, triglycerides 140 Glucose 95  Recent Lipid Panel    Component Value Date/Time   CHOL 193 09/29/2014 0833   TRIG 158 (H) 09/29/2014 0833   HDL 48 09/29/2014 0833   CHOLHDL 4.0 09/29/2014 0833   VLDL 32 (H) 09/29/2014 0833   LDLCALC 113 09/29/2014 0833  (on rosuva) Total cholesterol 271, triglycerides 154, HDL 65, LDL 192 (on simva)  Physical Exam:    VS:  BP 140/80   Pulse (!) 49   Ht 5\' 8"  (1.727 m)   Wt 201 lb (91.2 kg)   SpO2 96%   BMI 30.56 kg/m     Recheck BP 126/72 mmHg Wt Readings from Last 3 Encounters:  03/05/19 201 lb (91.2 kg)  02/13/18 197 lb 6.4 oz (89.5 kg)  05/13/17 179 lb 14.3 oz (81.6 kg)     General: Alert, oriented x3, no distress, mildly obese Head: no evidence of trauma, PERRL, EOMI, no exophtalmos or lid lag, no myxedema, no xanthelasma; normal ears, nose and oropharynx Neck: normal jugular venous pulsations and no hepatojugular reflux; brisk carotid pulses without delay and no carotid bruits Chest: clear  to auscultation, no signs of consolidation by percussion or palpation, normal fremitus, symmetrical and full respiratory excursions Cardiovascular: normal position and quality of the apical impulse, slow regular rhythm, normal first and second heart sounds, no murmurs, rubs or gallops Abdomen: no tenderness or distention, no masses by palpation, no abnormal pulsatility or arterial bruits, normal bowel sounds, no hepatosplenomegaly Extremities: no clubbing, cyanosis or edema; 2+ radial, ulnar and brachial pulses bilaterally; 2+ right femoral, posterior tibial and dorsalis pedis pulses; 2+ left femoral, posterior tibial and dorsalis pedis pulses; no subclavian or femoral bruits Neurological: Left Bell's palsy, otherwise grossly nonfocal Psych: Normal mood and affect     ASSESSMENT:    1. Hypercholesterolemia   2. Essential hypertension   3. Obesity, mild   4. History of gout   5. Aortic atherosclerosis (HCC)    PLAN:    In order of problems listed above:  1. HLP: He had severely elevated LDL cholesterol consistent with familial heterozygous hypercholesterolemia with a reasonably good response to high-dose rosuvastatin.  Need to get updated labs.  Discussed ezetimibe or Repatha, but until he gets better drug coverage in a year or 2 he does not want start any medications.  2. Obesity: Discussed ways to improve his diet by reducing portion sizes and avoiding simple carbohydrates or starches with high glycemic  index, increasing intake of lean protein and unsaturated fats.  Encourage him to keep exercising. 3. HTN: His blood pressure is a little higher than last year and borderline elevated.  Blood pressure was 134/76 at his last medical appointment on February 7.  Need to avoid diuretics due to his tendency to have gout and beta-blockers due to bradycardia.  He is on a maximum dose of amlodipine.  Focus on weight loss.  Sodium restriction.  If BP increases further would prefer use of a RAAS inhibitor 4. Gout: Avoid thiazide diuretics.  No recent gout attacks. 5. Aortic atherosclerosis: Mild, reported on CT abdomen with no aneurysm seen.  This is an incentive to treat his hypercholesterolemia more aggressively.   Medication Adjustments/Labs and Tests Ordered: Current medicines are reviewed at length with the patient today.  Concerns regarding medicines are outlined above.  Orders Placed This Encounter  Procedures  . Basic metabolic panel  . CBC  . TSH  . Lipid panel  . EKG 12-Lead   No orders of the defined types were placed in this encounter.   Signed, Thurmon Fair, MD  03/06/2019 12:01 PM     [facility]

## 2019-03-05 NOTE — Patient Instructions (Signed)
Medication Instructions:  No changes *If you need a refill on your cardiac medications before your next appointment, please call your pharmacy*   Lab Work: Your provider would like for you to return in a few weeks to have the following labs drawn: Fasting Lipid, CMET, CBC, andTSH. You do not need an appointment for the lab. Once in our office lobby there is a podium where you can sign in and ring the doorbell to alert [place] that you are here. The lab is open from 8:00 am to 4:30 pm; closed for lunch from 12:45pm-1:45pm.  If you have labs (blood work) drawn today and your tests are completely normal, you will receive your results only by: Marland Kitchen MyChart Message (if you have MyChart) OR . A paper copy in the mail If you have any lab test that is abnormal or we need to change your treatment, we will call you to review the results.   Testing/Procedures: None ordered   Follow-Up: At [facility], you and your health needs are our priority.  As part of our continuing mission to provide you with exceptional heart care, we have created designated Provider Care Teams.  These Care Teams include your primary Cardiologist (physician) and Advanced Practice Providers (APPs -  Physician Assistants and Nurse Practitioners) who all work together to provide you with the care you need, when you need it.  We recommend signing up for the patient portal called "MyChart".  Sign up information is provided on this After Visit Summary.  MyChart is used to connect with patients for Virtual Visits (Telemedicine).  Patients are able to view lab/test results, encounter notes, upcoming appointments, etc.  Non-urgent messages can be sent to your provider as well.   To learn more about what you can do with MyChart, go to [email or web address].    Your next appointment:   12 month(s)  The format for your next appointment:   In Person  Provider:   You may see Thurmon Fair, MD or one of the following Advanced Practice  Providers on your designated Care Team:    Azalee Course, PA-C  Micah Flesher, PA-C or   Judy Pimple, [place]

## 2019-03-06 ENCOUNTER — Encounter: Payer: Self-pay | Admitting: Cardiovascular Disease

## 2019-03-18 ENCOUNTER — Other Ambulatory Visit: Payer: Self-pay | Admitting: Cardiovascular Disease

## 2019-04-03 IMAGING — CT CT ABD-PELV W/ CM
2 of 5 series · 14 of 46 positions shown, 16 images · IV contrast (ISOVUE)
Comparison: Abdominal sonogram from earlier today.

CLINICAL DATA: Worsening right upper quadrant abdominal pain for
several days. Fevers. Chills. Myalgias.

EXAM:
CT ABDOMEN AND PELVIS WITH CONTRAST
TECHNIQUE: Multidetector CT imaging of the abdomen and pelvis was performed
using the standard protocol following bolus administration of
intravenous contrast.
CONTRAST:  100mL [id number] IOPAMIDOL ([id number]) INJECTION 61%

[Series 2: axial st · axial · 0.93mm/px · z∈[-620,-180]mm · 11 of 104 slices shown, 13 images]
[im 8/104  soft-tissue]
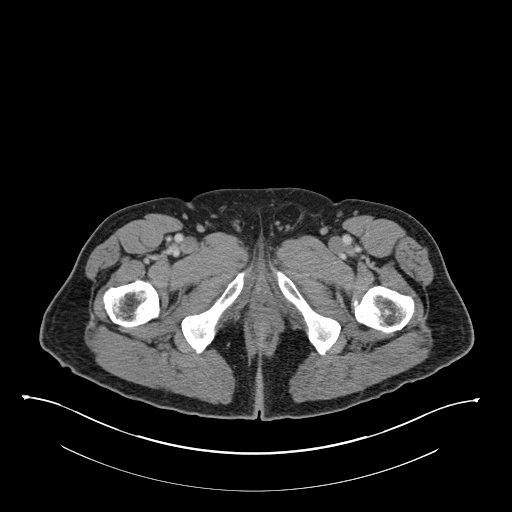
[im 8/104  bone]
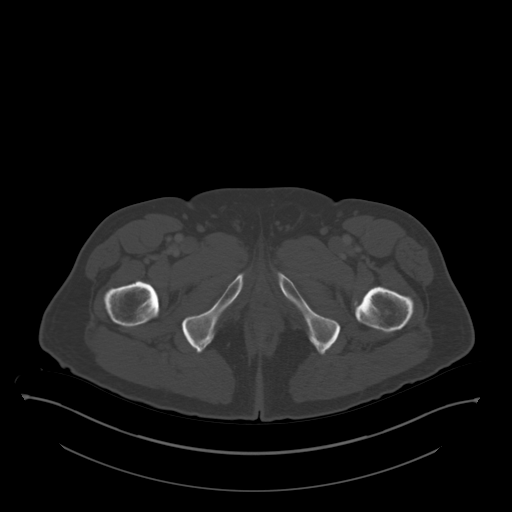
[im 15/104  soft-tissue]
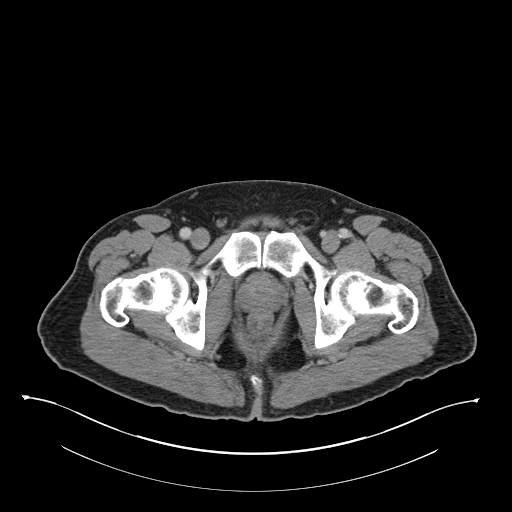
[im 23/104  soft-tissue]
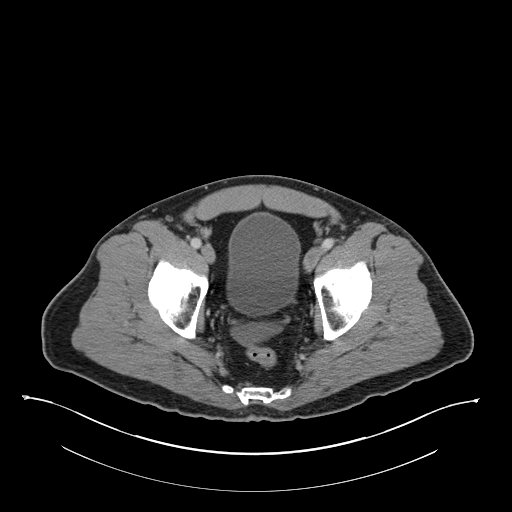
[im 37/104  soft-tissue]
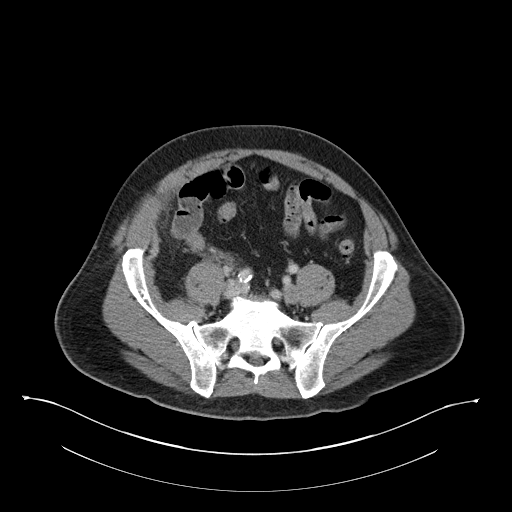
[im 45/104  soft-tissue]
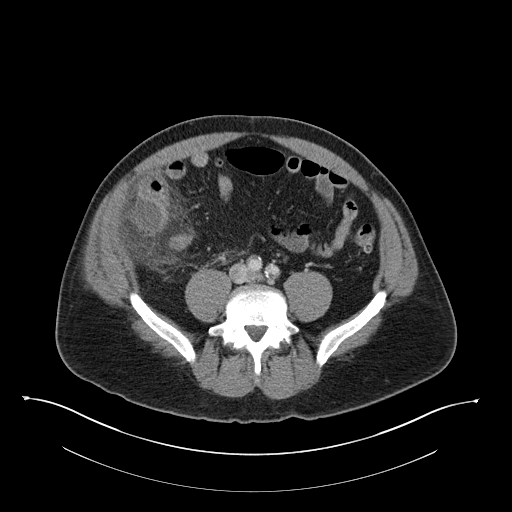
[im 52/104  soft-tissue]
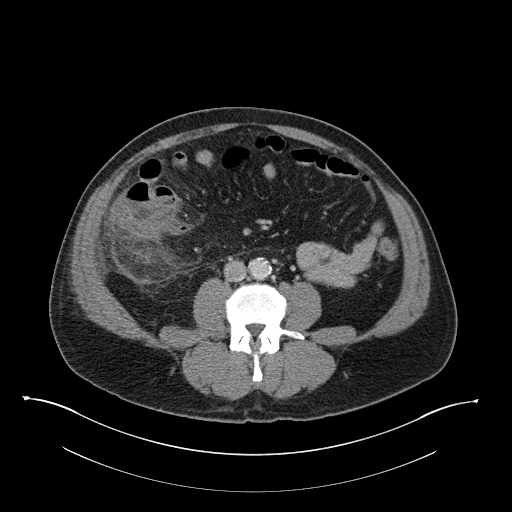
[im 59/104  soft-tissue]
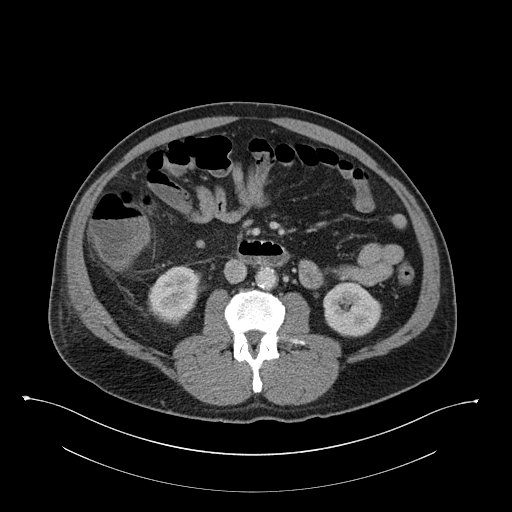
[im 67/104  soft-tissue]
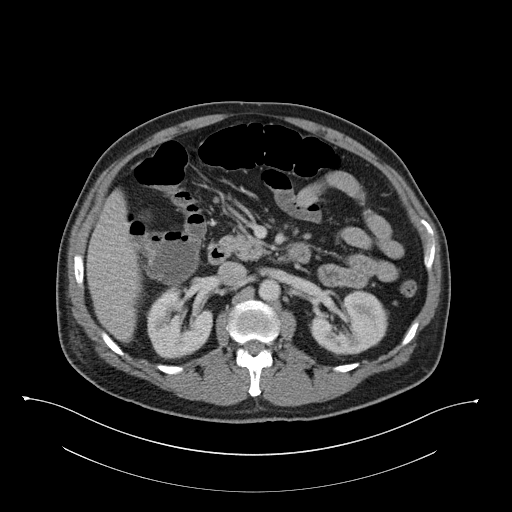
[im 81/104  soft-tissue]
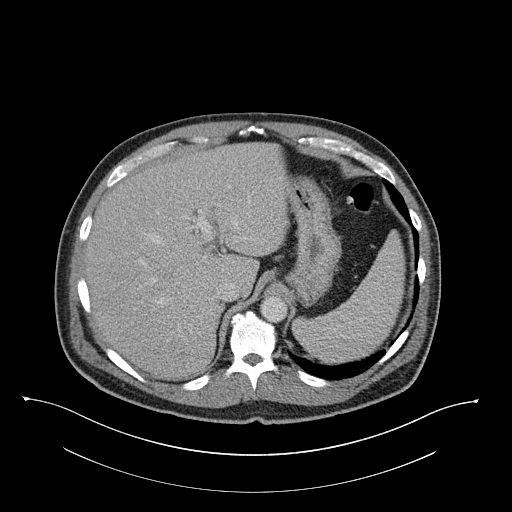
[im 81/104  bone]
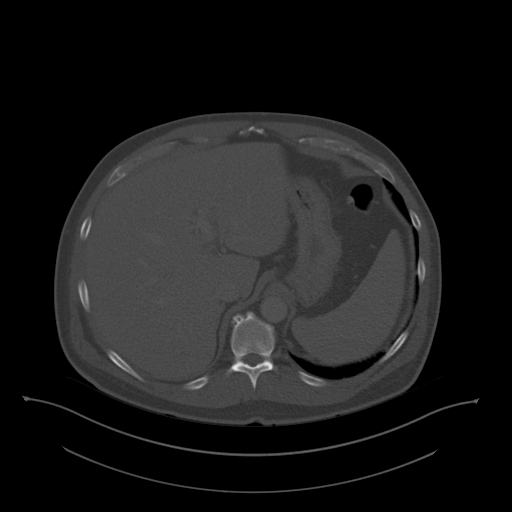
[im 89/104  soft-tissue]
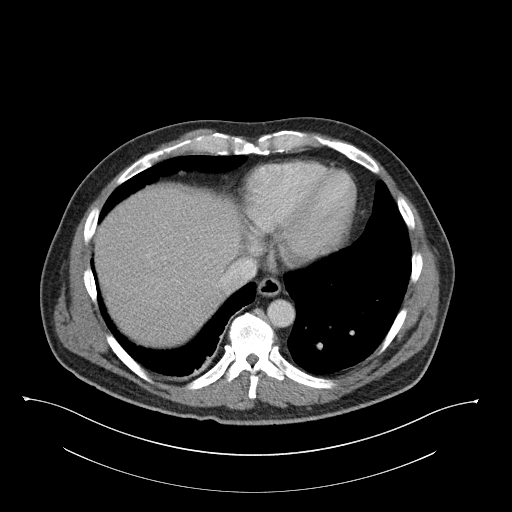
[im 96/104  soft-tissue]
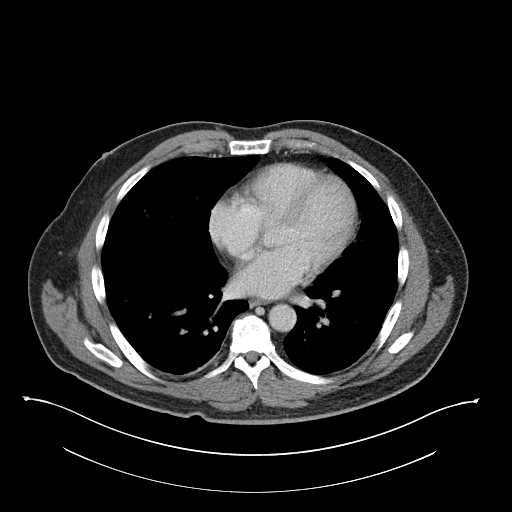

[Series 5: coronal st · coronal · 0.78mm/px · 3 of 105 slices shown]
[im 35/105  soft-tissue]
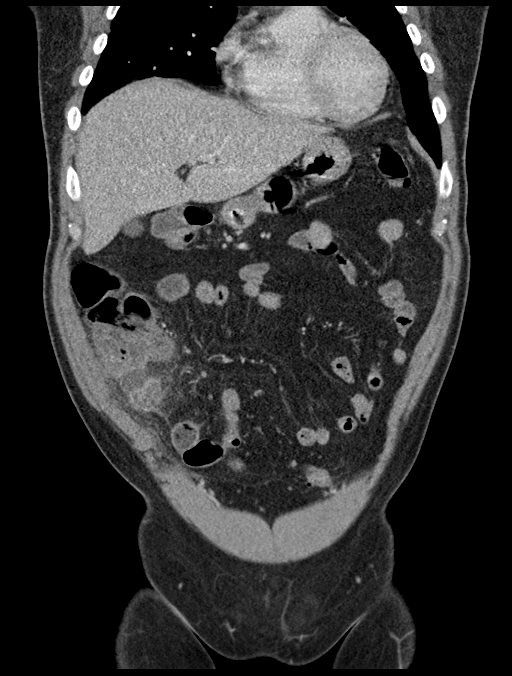
[im 47/105  soft-tissue]
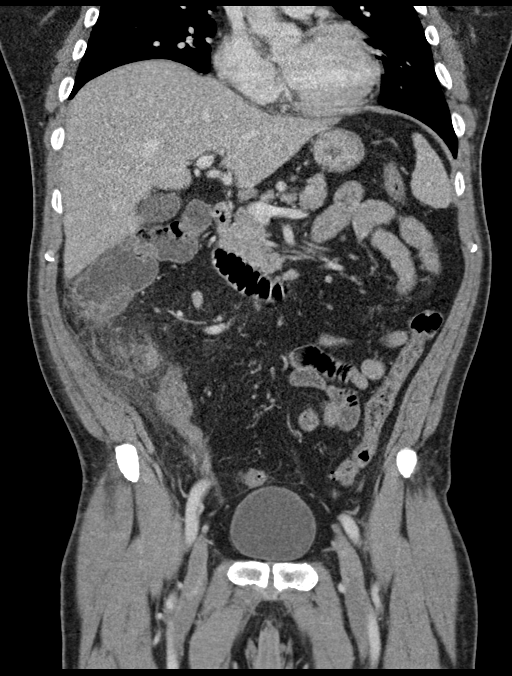
[im 58/105  soft-tissue]
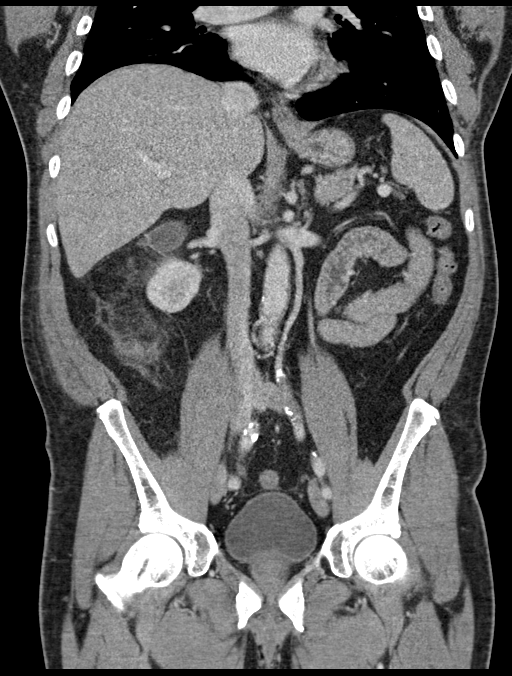

[14 of 46 positions shown; findings below may reference images not displayed]

FINDINGS: Lower chest: Two tiny scattered subpleural pulmonary nodules,
largest 3 mm in the right middle lobe (series 4/image 19).

Hepatobiliary: Normal liver size. No liver mass. Normal gallbladder
with no radiopaque cholelithiasis. No biliary ductal dilatation.

Pancreas: Normal, with no mass or duct dilation.

Spleen: Normal size. No mass.

Adrenals/Urinary Tract: Normal adrenals. Normal kidneys with no
hydronephrosis and no renal mass. Normal bladder.

Stomach/Bowel: Normal non-distended stomach. There are a few small
bowel loops in the right lower quadrant demonstrating wall
thickening (series 2/image 63), including the terminal ileum. No
significant small bowel dilatation. Prominently dilated appendix (17
mm diameter). Indistinct thickened appendiceal wall. Marked
periappendiceal fat stranding and ill-defined fluid. Findings are
compatible with acute appendicitis, probably perforated given the
phlegmonous periappendiceal changes. There are calcified
appendicoliths in the proximal appendix, largest 11 mm.

Appendix: Location: Right lower quadrant

Diameter: 17 mm

Appendicolith: Present

Mucosal hyper-enhancement: Present

Extraluminal gas: Not present

Periappendiceal collection: Not present

There is wall thickening throughout the base of the cecum. Mild left
colonic diverticulosis, without associated colonic wall thickening
or pericolonic fat stranding in the left colon.

Vascular/Lymphatic: Atherosclerotic nonaneurysmal abdominal aorta.
Patent portal, splenic, hepatic and renal veins. No pathologically
enlarged lymph nodes in the abdomen or pelvis.

Reproductive: Mildly enlarged prostate.

Other: No pneumoperitoneum, ascites or focal fluid collection.

Musculoskeletal: No aggressive appearing focal osseous lesions.
Moderate thoracolumbar spondylosis, most prominent at L5-S1.
IMPRESSION: 1. Acute appendicitis, probably perforated given the indistinct
appendiceal wall and prominent periappendiceal phlegmonous change.
Appendicoliths are present.
2. No free air.  No drainable fluid collection.
3. Wall thickening within multiple right lower quadrant small bowel
loops and at the base of the cecum, probably reactive.
4. Mild left colonic diverticulosis.
5. Tiny subpleural pulmonary nodules at the lung bases, largest 3
mm, probably benign. No follow-up needed if patient is low-risk (and
has no known or suspected primary neoplasm). Non-contrast chest CT
can be considered in 12 months if patient is high-risk. This
recommendation follows the consensus statement: Guidelines for
Management of Incidental Pulmonary Nodules Detected on CT
Images:From the [HOSPITAL] 0652; published online before
print (10.1148/radiol.[id number]).
6. Mild prostatomegaly.
7.  Aortic Atherosclerosis (2CSIU-CPM.M).

These results were called by telephone at the time of interpretation
on 04/01/2017 at [DATE] to Dr. NICKALOS MELHADO-MATALON , who verbally
acknowledged these results.

## 2019-04-27 ENCOUNTER — Other Ambulatory Visit: Payer: Self-pay | Admitting: Cardiovascular Disease

## 2019-04-28 ENCOUNTER — Other Ambulatory Visit: Payer: Self-pay

## 2019-04-28 MED ORDER — ROSUVASTATIN CALCIUM 20 MG PO TABS: 20.0000 mg | ORAL_TABLET | Freq: Every day | ORAL | 3 refills | Status: AC

## 2019-04-29 NOTE — Telephone Encounter (Signed)
Rx(s) sent to pharmacy electronically.  

## 2020-04-14 ENCOUNTER — Encounter: Payer: Self-pay | Admitting: Cardiovascular Disease

## 2020-04-14 ENCOUNTER — Other Ambulatory Visit: Payer: Self-pay

## 2020-04-14 ENCOUNTER — Ambulatory Visit (INDEPENDENT_AMBULATORY_CARE_PROVIDER_SITE_OTHER): Payer: Self-pay | Admitting: Cardiovascular Disease

## 2020-04-14 VITALS — BP 122/78 | HR 41 | Ht 68.0 in | Wt 197.0 lb

## 2020-04-14 DIAGNOSIS — I700 Atherosclerosis of aorta: Secondary | ICD-10-CM

## 2020-04-14 DIAGNOSIS — E7801 Familial hypercholesterolemia: Secondary | ICD-10-CM

## 2020-04-14 DIAGNOSIS — Z8739 Personal history of other diseases of the musculoskeletal system and connective tissue: Secondary | ICD-10-CM

## 2020-04-14 DIAGNOSIS — R001 Bradycardia, unspecified: Secondary | ICD-10-CM

## 2020-04-14 DIAGNOSIS — I10 Essential (primary) hypertension: Secondary | ICD-10-CM

## 2020-04-14 DIAGNOSIS — E663 Overweight: Secondary | ICD-10-CM

## 2020-04-14 NOTE — Progress Notes (Signed)
Cardiology Office Note:    Date:  04/14/2020   ID:  Dean Weaver, DOB 11-17-1955, MRN [id number] PCP:  Marden Noble, MD  Cardiologist:  Thurmon Fair, MD    Referring MD: Marden Noble, MD   Chief Complaint  Patient presents with  . Bradycardia  . Hyperlipidemia    History of Present Illness:    Dean Weaver is a 65 y.o. male with a hx of hypercholesterolemia (probably familial), systemic hypertension, gout, left-sided Bell's palsy and chronic sinus bradycardia who is here for routine cardiac follow-up.  He is remarkably bradycardic today.  Heart rate is 41 bpm.  ECG shows sinus mechanism.  He describes reduced energy levels in the afternoon.  He still walks [place] border [place] and goes to the gym, but has to take naps in the afternoon.  He does not have exertional dyspnea and has never had angina either at rest or with activity.  Denies palpitations, dizziness or syncope.  He has an apple watch that generally shows resting heart rates in the 40s, peak and daytime heart rates in the 80s and 90s, although occasionally it does report peak heart rates in the 120s-140s.  Had labs checked this morning with Dr. Kevan Ny.  The results are back.  A lipid profile was included, but Odies is not sure whether a TSH was checked.  Previous evaluation for mild bradycardia did not identify evidence of ischemic heart disease or need for pacemaker.  Abdominal CT shows some evidence of aortic atherosclerosis without aneurysm.  His maternal grandfather died in his 29s from heart disease, but his other relatives have not had premature heart problems.  Jami's ancestry is Ashkenazi Jewish. He is originally from [place], but has lived in [place] for over 40 years. 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  Past Medical History:  Diagnosis Date  . Bell's palsy    left side;   . Bradycardia    non symptomatic; has been evaluated by cardio Dr Royann Shivers . see epic encounter  01-01-17  . Dyspnea    per cardiologist note in epic  ; patient denies any recently   . Gout   . Heart murmur    patient self reports that his cardio Dr told hime he had a slight murmur but states " i dont experience anything with it though "  . History of appendicitis    reports he was told it ruptured   . Hyperlipidemia   . Hypertension     Past surgical history: Appendectomy 2019  Current Medications: Current Meds  Medication Sig  . allopurinol (ZYLOPRIM) 100 MG tablet Take 1 tablet (100 mg total) by mouth daily.  . cholecalciferol (VITAMIN D3) 25 MCG (1000 UT) tablet Take 1,000 Units by mouth daily.  Marland Kitchen FLUoxetine (PROZAC) 20 MG capsule Take 1 capsule (20 mg total) by mouth daily.  Marland Kitchen lisinopril (ZESTRIL) 40 MG tablet TAKE ONE TABLET BY MOUTH DAILY  . Multiple Vitamin (MULTIVITAMIN) tablet Take 1 tablet by mouth daily.  . naproxen sodium (ALEVE) 220 MG tablet Take 440 mg by mouth daily as needed.  . rosuvastatin (CRESTOR) 20 MG tablet Take 1 tablet (20 mg total) by mouth daily.  . vitamin E 180 MG (400 UNITS) capsule Take 400 Units by mouth daily.     Allergies:   Patient has no known allergies.   Social History   Socioeconomic History  . Marital status: Married    Spouse name: Not on file  . Number  of children: 2  . Years of education: Not on file  . Highest education level: Not on file  Occupational History  . Not on file  Tobacco Use  . Smoking status: Never Smoker  . Smokeless tobacco: Former Engineer, water and Sexual Activity  . Alcohol use: Not Currently    Alcohol/week: 3.0 standard drinks    Types: 3 Standard drinks or equivalent per week    Comment: no drink in 6 weeks   . Drug use: No  . Sexual activity: Not on file  Other Topics Concern  . Not on file  Social History Narrative  . Not on file   Social Determinants of Health   Financial Resource Strain: Not on file  Food Insecurity: Not on file  Transportation Needs: Not on file  Physical  Activity: Not on file  Stress: Not on file  Social Connections: Not on file     Family History: The patient's family history includes Cancer in his mother; Emphysema in his paternal grandmother; Heart attack in his maternal grandfather; Heart disease in his maternal grandfather and paternal grandfather; Hyperlipidemia in his father and sister; Hypertension in his father. ROS:   Please see the history of present illness.    All other systems are reviewed and are negative.  EKGs/Labs/Other Studies Reviewed:   EKG:  EKG is ordered today.  It shows marked sinus bradycardia at 41 bpm and left anterior fascicular block.  Other than the slow heart rate, no significant change from last year.  The following studies were reviewed today: Labs, Provided by the patient from February 06, 2018  Hemoglobin 14.9, creatinine 1.5, normal liver function tests, potassium 4.9 Total cholesterol 227, HDL 71, LDL 143, triglycerides 140 Glucose 95  Recent Lipid Panel    Component Value Date/Time   CHOL 193 09/29/2014 0833   TRIG 158 (H) 09/29/2014 0833   HDL 48 09/29/2014 0833   CHOLHDL 4.0 09/29/2014 0833   VLDL 32 (H) 09/29/2014 0833   LDLCALC 113 09/29/2014 0833  (on rosuva) Total cholesterol 271, triglycerides 154, HDL 65, LDL 192 (on simva)  Physical Exam:    VS:  BP 122/78 (BP Location: Right Arm, Patient Position: Sitting, Cuff Size: Normal)   Pulse (!) 41   Ht 5\' 8"  (1.727 m)   Wt 197 lb (89.4 kg)   BMI 29.95 kg/m    Recheck BP 126/72 mmHg Wt Readings from Last 3 Encounters:  04/14/20 197 lb (89.4 kg)  03/05/19 201 lb (91.2 kg)  02/13/18 197 lb 6.4 oz (89.5 kg)     General: Alert, oriented x3, no distress, borderline obese Head: no evidence of trauma, PERRL, EOMI, no exophtalmos or lid lag, no myxedema, no xanthelasma; normal ears, nose and oropharynx Neck: normal jugular venous pulsations and no hepatojugular reflux; brisk carotid pulses without delay and no carotid bruits Chest:  clear to auscultation, no signs of consolidation by percussion or palpation, normal fremitus, symmetrical and full respiratory excursions Cardiovascular: normal position and quality of the apical impulse, regular rhythm, normal first and second heart sounds, no murmurs, rubs or gallops Abdomen: no tenderness or distention, no masses by palpation, no abnormal pulsatility or arterial bruits, normal bowel sounds, no hepatosplenomegaly Extremities: no clubbing, cyanosis or edema; 2+ radial, ulnar and brachial pulses bilaterally; 2+ right femoral, posterior tibial and dorsalis pedis pulses; 2+ left femoral, posterior tibial and dorsalis pedis pulses; no subclavian or femoral bruits Neurological: Some sequelae of left-sided Bell's palsy, otherwise grossly nonfocal Psych: Normal mood and  affect  ASSESSMENT:    1. Heterozygous familial hypercholesterolemia   2. Overweight (BMI 25.0-29.9)   3. Essential hypertension   4. History of gout   5. Aortic atherosclerosis (HCC)   6. Bradycardia    PLAN:    In order of problems listed above:  1. HLP: He had severely elevated LDL cholesterol consistent with familial heterozygous hypercholesterolemia with a reasonably good response to high-dose rosuvastatin.  Need to get updated labs.  Discussed ezetimibe or Repatha, but will wait for the updated labs drawn today. 2. Overweight: He has made some progress in losing weight and now his BMI is just under 30.  Encouraged him to keep up the good work. 3. HTN: This has been consistently well controlled following his weight loss. 4. Gout: Avoid thiazide diuretics.  No recent gout attacks. 5. Aortic atherosclerosis: Mild, reported on CT abdomen with no aneurysm seen.  This is an incentive to treat his hypercholesterolemia more aggressively. 6. Sinus bradycardia: He does appear to describe symptoms of chronotropic incompetence in addition to the marked bradycardia at rest.  Apple watch does show occasional increases in  heart rate well above 120 bpm, but for the most part his peak daily heart rates are in the 90s (less than 70% of maximum predicted heart rate which is around 109).   Need to make sure he does not have hypothyroidism.  Discussed possible need for pacemaker therapy in some detail.    Medication Adjustments/Labs and Tests Ordered: Current medicines are reviewed at length with the patient today.  Concerns regarding medicines are outlined above.  Orders Placed This Encounter  Procedures  . EKG 12-Lead   No orders of the defined types were placed in this encounter.   Signed, Thurmon Fair, MD  04/14/2020 11:20 AM    [facility]

## 2020-04-14 NOTE — Patient Instructions (Signed)

## 2020-05-22 ENCOUNTER — Other Ambulatory Visit: Payer: Self-pay | Admitting: Podiatry

## 2020-05-22 MED ORDER — PREDNISONE 10 MG PO TABS: ORAL_TABLET | ORAL | 0 refills | Status: DC

## 2020-06-22 ENCOUNTER — Other Ambulatory Visit: Payer: Self-pay | Admitting: Podiatry

## 2020-06-22 DIAGNOSIS — M109 Gout, unspecified: Secondary | ICD-10-CM

## 2020-07-21 LAB — LYME DISEASE, WESTERN BLOT
IgG P18 Ab.: ABSENT
IgG P23 Ab.: ABSENT
IgG P28 Ab.: ABSENT
IgG P30 Ab.: ABSENT
IgG P39 Ab.: ABSENT
IgG P45 Ab.: ABSENT
IgG P58 Ab.: ABSENT
IgG P66 Ab.: ABSENT
IgG P93 Ab.: ABSENT
IgM P23 Ab.: ABSENT
IgM P39 Ab.: ABSENT
IgM P41 Ab.: ABSENT
Lyme IgG Wb: NEGATIVE
Lyme IgM Wb: NEGATIVE

## 2020-07-21 LAB — CBC WITH DIFFERENTIAL/PLATELET
Basophils Absolute: 0.1 10*3/uL (ref 0.0–0.2)
Basos: 0 %
EOS (ABSOLUTE): 0 10*3/uL (ref 0.0–0.4)
Eos: 0 %
Hematocrit: 42.3 % (ref 37.5–51.0)
Hemoglobin: 14.3 g/dL (ref 13.0–17.7)
Immature Grans (Abs): 0.1 10*3/uL (ref 0.0–0.1)
Immature Granulocytes: 1 %
Lymphocytes Absolute: 1.9 10*3/uL (ref 0.7–3.1)
Lymphs: 15 %
MCH: 30 pg (ref 26.6–33.0)
MCHC: 33.8 g/dL (ref 31.5–35.7)
MCV: 89 fL (ref 79–97)
Monocytes Absolute: 0.7 10*3/uL (ref 0.1–0.9)
Monocytes: 6 %
Neutrophils Absolute: 9.7 10*3/uL — ABNORMAL HIGH (ref 1.4–7.0)
Neutrophils: 78 %
Platelets: 254 10*3/uL (ref 150–450)
RBC: 4.77 x10E6/uL (ref 4.14–5.80)
RDW: 12.4 % (ref 11.6–15.4)
WBC: 12.5 10*3/uL — ABNORMAL HIGH (ref 3.4–10.8)

## 2020-07-21 LAB — URIC ACID: Uric Acid: 5.8 mg/dL (ref 3.8–8.4)

## 2020-07-21 LAB — RHEUMATOID ARTHRITIS PROFILE
Cyclic Citrullin Peptide Ab: 1 units (ref 0–19)
Rheumatoid fact SerPl-aCnc: <10 IU/mL (ref <14.0)

## 2020-07-21 LAB — SEDIMENTATION RATE: Sed Rate: 15 mm/hr (ref 0–30)

## 2020-07-21 LAB — C-REACTIVE PROTEIN: CRP: 3 mg/L (ref 0–10)

## 2020-12-19 ENCOUNTER — Ambulatory Visit (INDEPENDENT_AMBULATORY_CARE_PROVIDER_SITE_OTHER): Payer: Medicare Other | Admitting: Sports Medicine

## 2020-12-19 VITALS — BP 148/88 | Ht 68.0 in | Wt 200.0 lb

## 2020-12-19 DIAGNOSIS — M7711 Lateral epicondylitis, right elbow: Secondary | ICD-10-CM | POA: Diagnosis present

## 2020-12-19 DIAGNOSIS — M7522 Bicipital tendinitis, left shoulder: Secondary | ICD-10-CM

## 2020-12-20 NOTE — Progress Notes (Signed)
° °  Subjective:    Patient ID: Dean Weaver, male    DOB: 1955/09/21, 65 y.o.   MRN: [id number]  HPI chief complaint: Right elbow and left shoulder pain  Echo is a very pleasant 65 year old right-hand-dominant male that comes in today complaining of right elbow pain and left shoulder pain this been present for several months.  No injury that he can recall but he enjoys kayaking and fishing.  He believes that his symptoms may have begun with that.  His right elbow pain is along the lateral epicondyle.  It is worse with any sort of activity with the right arm such as holding any sort of object with weight.  Also pain with direct pressure over the lateral epicondyle.  He localizes his left shoulder pain to the anterior shoulder.  He has not noticed any decrease in range of motion or loss of strength.  For both the elbow and the shoulder he was prescribed a 12-day prednisone taper which did not help much.  He has purchased a counterforce brace for the right elbow but it has not been beneficial.  He denies prior surgeries to either the right elbow or left shoulder.  Past medical history reviewed Medications reviewed Allergies reviewed    Review of Systems As above    Objective:   Physical Exam  Well-developed, well-nourished.  No acute distress  Right elbow: Full range of motion.  No effusion.  No soft tissue swelling.  There is tenderness to palpation directly over the lateral epicondyle with reproducible pain with ECRB testing.  No tenderness along the medial epicondyle.  Good pulses distally.  Decreased grip strength secondary to pain.  Left shoulder: Full range of motion.  No tenderness over the [facility] joint.  Slight tenderness to palpation over the proximal biceps tendon in the bicipital groove but a negative speeds and negative Yergason's.  Negative empty can, negative Hawkins.  Rotator cuff strength is 5/5.  Negative O'Brien's.  Neurovascularly intact distally.  Limited bedside ultrasound of  the right lateral epicondyle shows a small spur off of the lateral epicondyle but no significant hypoechoic changes to suggest tearing of the common extensor tendon.      Assessment & Plan:   Right elbow pain secondary to lateral epicondylitis Left shoulder pain likely secondary to proximal biceps tendinitis  For both his right elbow and left shoulder pain I recommended formal physical therapy.  Follow-up with me again in 4 weeks for reevaluation.  Call with questions or concerns in the interim.  This note was dictated using Dragon naturally speaking software and may contain errors in syntax, spelling, or content which have not been identified prior to signing this note.

## 2021-01-18 ENCOUNTER — Ambulatory Visit (INDEPENDENT_AMBULATORY_CARE_PROVIDER_SITE_OTHER): Payer: Medicare Other | Admitting: Sports Medicine

## 2021-01-18 VITALS — BP 128/88 | Ht 68.0 in | Wt 200.0 lb

## 2021-01-18 DIAGNOSIS — M7711 Lateral epicondylitis, right elbow: Secondary | ICD-10-CM

## 2021-01-18 DIAGNOSIS — M7522 Bicipital tendinitis, left shoulder: Secondary | ICD-10-CM | POA: Diagnosis not present

## 2021-01-18 NOTE — Progress Notes (Signed)
° °  Subjective:    Patient ID: Dean Weaver, male    DOB: [birth year], 66 y.o.   MRN: [id number]  HPI Patient presents with for follow up after experiencing right elbow pain and left shoulder pain. States that he believes this pain is from overuse as he does a lot of fishing. Endorses that pain has improved for both. He has been participating in physical therapy for his right elbow but not his left shoulder as it was not being approved although present in the initial order. His elbow is improving, just sometimes feels stiff but not nearly as painful or tender as it was initially, denies any pain today. Over the past week, has felt the best as he is continuing PT and engaging in strengthening exercises occasionally at home. Denies any radiating pain, numbness or tingling. Regarding his left shoulder, this has also improved slightly but still experiencing occasional pain especially with activity. Applying heat and taking aleve has improved the pain overall.    Review of Systems As above     Objective:   Physical Exam General: Patient well-appearing, in no acute distress.  MSK:  Right elbow Inspection reveals no gross deformity or abnormality. No evidence of ecchymosis, edema or skin changes noted. Palpation reveals minimal point tenderness along right lateral epicondyle upon deep palpation without any other tenderness. ROM: normal active range of motion of right elbow and shoulder joint Special testing: negative Yergason's and Speeds testing, reproducible pain with ECRB testing   Left shoulder  Inspection reveals no gross deformity; without evidence of edema or erythema.  Palpation reveals point tenderness along biceps tendon.  ROM: normal active range of motion without limitations Special testing: negative Hawkin's and Neer testing, Negative supraspinatus or other rotator cuff testing   Neuro: gross sensation intact, CN 11 intact, 5/5 grip strength bilaterally, 5/5 UE strength bilaterally,  2+ biceps and brachioradialis reflexes, normal gait      Assessment & Plan:  Lateral epicondylitis: Continue with PT and strengthening exercises. Consider shockwave therapy per patient request, will plan for follow up for this. Biceps tendonitis: PT order placed again, instructed participate in regular physical therapy.    Patient seen and evaluated with the resident.  I agree with the above plan of care.  Patient has noticed improvement with physical therapy but would like to entertain other treatment options.  We did discuss the possibility of ECSWT.  I will see him back in the office in 4 weeks but he would like to start ECSWT treatments sooner than that that he will schedule these.  We will also continue with physical therapy and I have reinstructed the physical therapist to treat his proximal biceps tendinitis.

## 2021-02-15 ENCOUNTER — Ambulatory Visit (INDEPENDENT_AMBULATORY_CARE_PROVIDER_SITE_OTHER): Payer: Medicare Other | Admitting: Sports Medicine

## 2021-02-15 VITALS — BP 108/84 | Ht 68.0 in | Wt 200.0 lb

## 2021-02-15 DIAGNOSIS — M7711 Lateral epicondylitis, right elbow: Secondary | ICD-10-CM | POA: Diagnosis present

## 2021-02-15 MED ORDER — NITROGLYCERIN 0.2 MG/HR TD PT24: MEDICATED_PATCH | TRANSDERMAL | 1 refills | Status: AC

## 2021-02-15 NOTE — Patient Instructions (Signed)
It was a pleasure seeing you today!  You came in for your elbow pain, and we discussed continuing your exercises and PT, and we are also starting you on a nitroglycerin patch.  As we discussed if your pain does not improve, we will see you at follow-up and likely get an MRI at that time.  Nitroglycerin Protocol  Apply 1/4 nitroglycerin patch to affected area daily. Change position of patch within the affected area every 24 hours. You may experience a headache during the first 1-2 weeks of using the patch, these should subside. If you experience headaches after beginning nitroglycerin patch treatment, you may take your preferred over the counter pain reliever. Another side effect of the nitroglycerin patch is skin irritation or rash related to patch adhesive. Please notify our office if you develop more severe headaches or rash, and stop the patch. Tendon healing with nitroglycerin patch may require 12 to 24 weeks depending on the extent of injury. Men should not use if taking Viagra, Cialis, or Levitra.  Do not use if you have migraines or rosacea.    Call us if you have any questions or concerns.  Take care!

## 2021-02-15 NOTE — Progress Notes (Deleted)
PCP: Marden Noble, MD  Subjective:   HPI: Patient is a 66 y.o. male here for follow up on lateral epicondylitis and biceps tendonitis .    From visit on 1/12: Patient presents with for follow up after experiencing right elbow pain and left shoulder pain. States that he believes this pain is from overuse as he does a lot of fishing. Endorses that pain has improved for both. He has been participating in physical therapy for his right elbow but not his left shoulder as it was not being approved although present in the initial order. His elbow is improving, just sometimes feels stiff but not nearly as painful or tender as it was initially, denies any pain today. Over the past week, has felt the best as he is continuing PT and engaging in strengthening exercises occasionally at home. Denies any radiating pain, numbness or tingling. Regarding his left shoulder, this has also improved slightly but still experiencing occasional pain especially with activity. Applying heat and taking aleve has improved the pain overall.    Wandering about injection for medial epicondyle or oral steroids  ***  Past Medical History:  Diagnosis Date   Bell's palsy    left side;    Bradycardia    non symptomatic; has been evaluated by cardio Dr Royann Shivers . see epic encounter 01-01-17   Dyspnea    per cardiologist note in epic  ; patient denies any recently    Gout    Heart murmur    patient self reports that his cardio Dr told hime he had a slight murmur but states " i dont experience anything with it though "   History of appendicitis    reports he was told it ruptured    Hyperlipidemia    Hypertension     Current Outpatient Medications on File Prior to Visit  Medication Sig Dispense Refill   allopurinol (ZYLOPRIM) 100 MG tablet Take 1 tablet (100 mg total) by mouth daily. 90 tablet 3   cholecalciferol (VITAMIN D3) 25 MCG (1000 UT) tablet Take 1,000 Units by mouth daily.     FLUoxetine (PROZAC) 20 MG capsule  Take 1 capsule (20 mg total) by mouth daily. 90 capsule 3   lisinopril (ZESTRIL) 40 MG tablet TAKE ONE TABLET BY MOUTH DAILY 90 tablet 3   Multiple Vitamin (MULTIVITAMIN) tablet Take 1 tablet by mouth daily.     naproxen sodium (ALEVE) 220 MG tablet Take 440 mg by mouth daily as needed.     predniSONE (DELTASONE) 10 MG tablet 12 day tapering dose 48 tablet 0   rosuvastatin (CRESTOR) 20 MG tablet Take 1 tablet (20 mg total) by mouth daily. 90 tablet 3   vitamin E 180 MG (400 UNITS) capsule Take 400 Units by mouth daily.     No current facility-administered medications on file prior to visit.    Past Surgical History:  Procedure Laterality Date   COLONOSCOPY     10 year track , due for one soon    LAPAROSCOPIC APPENDECTOMY N/A 05/08/2017   Procedure: LAPAROSCOPIC APPENDECTOMY CONVERTED TO LAP ASSISTED ILEOCECTOMY ERAS PATHWAY;  Surgeon: [name], MD;  Location: [facility];  Service: General;  Laterality: N/A;   TONSILLECTOMY     childhood     No Known Allergies  BP 108/84    Ht 5\' 8"  (1.727 m)    Wt 200 lb (90.7 kg)    BMI 30.41 kg/m   [facility] 12/19/2020 01/18/2021 02/15/2021  Frequency of aerobic  exercise (# of days/week) 5 5 5   Average time in minutes 45 45 45  Frequency of strengthening activities (# of days/week) 4 4 4     No flowsheet data found.      Objective:  Physical Exam:  Gen: NAD, comfortable in exam room  ***   Assessment & Plan:  1. ***

## 2021-02-15 NOTE — Progress Notes (Signed)
° °  Subjective:    Patient ID: Dean Weaver, male    DOB: 12/09/1955, 66 y.o.   MRN: [id number]  HPI  Dean Weaver presents today for follow-up on right elbow lateral epicondylitis and left shoulder biceps tendinitis.  Although he does continue to improve in regards to his right elbow, he is becoming somewhat frustrated with the chronicity of his pain.  His left biceps tendon is less bothersome than previously.  His right elbow pain persist despite physical therapy and counterforce bracing.  He does have more physical therapy visits scheduled.  He is an avid fisherman and finds that certain positions of his elbow when casting cause pain.  He also notes pain when playing the clarinet and when using the mouse on his computer.    Review of Systems As above    Objective:   Physical Exam  Right elbow: Tender to palpation over the lateral epicondyle.  Reproducible pain with ECRB testing.  Neurovascularly intact distally.  Limited MSK ultrasound over the lateral right elbow was performed.  Once again seen is a small traction spur with hypoechoic change around it.  Findings are consistent with probable interstitial tearing and tendinopathy of the common extensor tendon.      Assessment & Plan:   Right elbow pain secondary to lateral epicondylitis  Dean Weaver has had symptoms now for several months.  I recommended that we add a nitroglycerin patch to his current treatment.  He will utilize a quarter patch daily.  He will continue with physical therapy and may start to wean to home exercise program per the therapist's discretion.  I did explain to Dean Weaver that if his symptoms do not continue to improve over the next 4 weeks then we should consider merits of an MRI possibly for presurgical planning.  Dean Weaver is in agreement with that plan.

## 2021-04-18 ENCOUNTER — Ambulatory Visit (INDEPENDENT_AMBULATORY_CARE_PROVIDER_SITE_OTHER): Payer: Medicare Other | Admitting: Cardiovascular Disease

## 2021-04-18 ENCOUNTER — Encounter: Payer: Self-pay | Admitting: Cardiovascular Disease

## 2021-04-18 VITALS — BP 170/92 | HR 45 | Ht 68.0 in | Wt 200.6 lb

## 2021-04-18 DIAGNOSIS — E663 Overweight: Secondary | ICD-10-CM | POA: Diagnosis not present

## 2021-04-18 DIAGNOSIS — E7801 Familial hypercholesterolemia: Secondary | ICD-10-CM | POA: Diagnosis not present

## 2021-04-18 DIAGNOSIS — R001 Bradycardia, unspecified: Secondary | ICD-10-CM

## 2021-04-18 DIAGNOSIS — Z8739 Personal history of other diseases of the musculoskeletal system and connective tissue: Secondary | ICD-10-CM | POA: Diagnosis not present

## 2021-04-18 DIAGNOSIS — R5383 Other fatigue: Secondary | ICD-10-CM

## 2021-04-18 DIAGNOSIS — I700 Atherosclerosis of aorta: Secondary | ICD-10-CM | POA: Diagnosis not present

## 2021-04-18 DIAGNOSIS — I10 Essential (primary) hypertension: Secondary | ICD-10-CM | POA: Diagnosis not present

## 2021-04-18 LAB — COMPREHENSIVE METABOLIC PANEL
ALT: 26 IU/L (ref 0–44)
AST: 25 IU/L (ref 0–40)
Albumin/Globulin Ratio: 3 — ABNORMAL HIGH (ref 1.2–2.2)
Albumin: 4.8 g/dL (ref 3.8–4.8)
Alkaline Phosphatase: 74 IU/L (ref 44–121)
BUN/Creatinine Ratio: 17 (ref 10–24)
BUN: 22 mg/dL (ref 8–27)
Bilirubin Total: 0.6 mg/dL (ref 0.0–1.2)
CO2: 25 mmol/L (ref 20–29)
Calcium: 9.4 mg/dL (ref 8.6–10.2)
Chloride: 104 mmol/L (ref 96–106)
Creatinine, Ser: 1.28 mg/dL — ABNORMAL HIGH (ref 0.76–1.27)
Globulin, Total: 1.6 g/dL (ref 1.5–4.5)
Glucose: 88 mg/dL (ref 70–99)
Potassium: 4.6 mmol/L (ref 3.5–5.2)
Sodium: 143 mmol/L (ref 134–144)
Total Protein: 6.4 g/dL (ref 6.0–8.5)
eGFR: 62 mL/min/{1.73_m2} (ref >59)

## 2021-04-18 LAB — LIPID PANEL
Chol/HDL Ratio: 5.8 ratio — ABNORMAL HIGH (ref 0.0–5.0)
Cholesterol, Total: 271 mg/dL — ABNORMAL HIGH (ref 100–199)
HDL: 47 mg/dL (ref >39)
LDL Chol Calc (NIH): 184 mg/dL — ABNORMAL HIGH (ref 0–99)
Triglycerides: 211 mg/dL — ABNORMAL HIGH (ref 0–149)
VLDL Cholesterol Cal: 40 mg/dL (ref 5–40)

## 2021-04-18 LAB — CBC
Hematocrit: 45.4 % (ref 37.5–51.0)
Hemoglobin: 15.4 g/dL (ref 13.0–17.7)
MCH: 30.3 pg (ref 26.6–33.0)
MCHC: 33.9 g/dL (ref 31.5–35.7)
MCV: 89 fL (ref 79–97)
Platelets: 218 10*3/uL (ref 150–450)
RBC: 5.08 x10E6/uL (ref 4.14–5.80)
RDW: 12.6 % (ref 11.6–15.4)
WBC: 8.7 10*3/uL (ref 3.4–10.8)

## 2021-04-18 LAB — TSH: TSH: 1.38 u[IU]/mL (ref 0.450–4.500)

## 2021-04-18 LAB — PSA: Prostate Specific Ag, Serum: 0.8 ng/mL (ref 0.0–4.0)

## 2021-04-18 NOTE — Patient Instructions (Signed)
Medication Instructions:  ?No changes ?*If you need a refill on your cardiac medications before your next appointment, please call your pharmacy* ? ? ?Lab Work: ?Your provider would like for you to have the following labs today: Lipid, TSH, CBC, PSA, CMET ? ?If you have labs (blood work) drawn today and your tests are completely normal, you will receive your results only by: ?MyChart Message (if you have MyChart) OR ?A paper copy in the mail ?If you have any lab test that is abnormal or we need to change your treatment, we will call you to review the results. ? ? ?Testing/Procedures: ?None ordered ? ? ?Follow-Up: ?At [facility], you and your health needs are our priority.  As part of our continuing mission to provide you with exceptional heart care, we have created designated Provider Care Teams.  These Care Teams include your primary Cardiologist (physician) and Advanced Practice Providers (APPs -  Physician Assistants and Nurse Practitioners) who all work together to provide you with the care you need, when you need it. ? ?We recommend signing up for the patient portal called "MyChart".  Sign up information is provided on this After Visit Summary.  MyChart is used to connect with patients for Virtual Visits (Telemedicine).  Patients are able to view lab/test results, encounter notes, upcoming appointments, etc.  Non-urgent messages can be sent to your provider as well.   ?To learn more about what you can do with MyChart, go to [email or web address].   ? ?Your next appointment:   ?12 month(s) ? ?The format for your next appointment:   ?In Person ? ?Provider:   ?Sanda Klein, MD { ? ? ?Important Information About Sugar ? ? ? ? ? ? ?

## 2021-04-18 NOTE — Progress Notes (Signed)
?Cardiology Office Note:   ? ?Date:  04/19/2021  ? ?ID:  Dean Weaver, DOB [birth year], MRN [id number] ?PCP:  Marden Noble, MD  ?Cardiologist:  Thurmon Fair, MD   ? ?Referring MD: Marden Noble, MD  ? ?Chief Complaint  ?Patient presents with  ? Hypertension  ? Hyperlipidemia  ? ? ?History of Present Illness:   ? ?Dean Weaver is a 66 y.o. male with a hx of hypercholesterolemia (probably familial), systemic hypertension, gout, left-sided Bell's palsy and chronic sinus bradycardia who is here for routine cardiac follow-up. ? ?As usual, his heart rate in the 40s, but he is completely asymptomatic.  He wears an Scientist, physiological and since his last appointment he has never reported a heart rate less than 45 bpm.  With exercise his heart rate appears to increase normally to 100 beats a minute.  He has an underlying left anterior fascicular block. ? ?He has recently had problems with pain in his right upper extremity which he defines as "tennis elbow" and has been taking ibuprofen fairly frequently at least 800 mg a day.  He is also been taking this because he has a recent gout attack.  He seems to believe that there is persistent inflammation and has been taking this medication daily.  ? ?His BP is high today, unusual for him. ? ?The patient specifically denies any chest pain at rest exertion, dyspnea at rest or with exertion, orthopnea, paroxysmal nocturnal dyspnea, syncope, palpitations, focal neurological deficits, intermittent claudication, lower extremity edema, unexplained weight gain, cough, hemoptysis or wheezing. ? ?Previous evaluation for mild bradycardia did not identify evidence of ischemic heart disease or need for pacemaker.  Abdominal CT shows some evidence of aortic atherosclerosis without aneurysm. ? ?His maternal grandfather died in his 76s from heart disease, but his other relatives have not had premature heart problems.  Dean Weaver's ancestry is Ashkenazi Jewish. He is originally from [place], but has lived  in [place] for over 40 years. 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 ? ?Past Medical History:  ?Diagnosis Date  ? Bell's palsy   ? left side;   ? Bradycardia   ? non symptomatic; has been evaluated by cardio Dr Royann Shivers . see epic encounter 01-01-17  ? Dyspnea   ? per cardiologist note in epic  ; patient denies any recently   ? Gout   ? Heart murmur   ? patient self reports that his cardio Dr told hime he had a slight murmur but states " i dont experience anything with it though "  ? History of appendicitis   ? reports he was told it ruptured   ? Hyperlipidemia   ? Hypertension   ? ? ?Past surgical history: Appendectomy 2019 ? ?Current Medications: ?Current Meds  ?Medication Sig  ? allopurinol (ZYLOPRIM) 100 MG tablet Take 1 tablet (100 mg total) by mouth daily.  ? cholecalciferol (VITAMIN D3) 25 MCG (1000 UT) tablet Take 1,000 Units by mouth daily.  ? FLUoxetine (PROZAC) 20 MG capsule Take 1 capsule (20 mg total) by mouth daily.  ? lisinopril (ZESTRIL) 40 MG tablet TAKE ONE TABLET BY MOUTH DAILY  ? Multiple Vitamin (MULTIVITAMIN) tablet Take 1 tablet by mouth daily.  ? naproxen sodium (ALEVE) 220 MG tablet Take 440 mg by mouth daily as needed.  ? nitroGLYCERIN (NITRODUR - DOSED IN MG/24 HR) 0.2 mg/hr patch Use 1/4 patch daily to the affected area.  ? predniSONE (DELTASONE) 10 MG tablet 12 day tapering dose  ?  rosuvastatin (CRESTOR) 20 MG tablet Take 1 tablet (20 mg total) by mouth daily.  ? vitamin E 180 MG (400 UNITS) capsule Take 400 Units by mouth daily.  ?  ? ?Allergies:   Patient has no known allergies.  ? ?Social History  ? ?Socioeconomic History  ? Marital status: Married  ?  Spouse name: Not on file  ? Number of children: 2  ? Years of education: Not on file  ? Highest education level: Not on file  ?Occupational History  ? Not on file  ?Tobacco Use  ? Smoking status: Never  ? Smokeless tobacco: Former  ?Substance and Sexual Activity  ? Alcohol use: Not Currently  ?   Alcohol/week: 3.0 standard drinks  ?  Types: 3 Standard drinks or equivalent per week  ?  Comment: no drink in 6 weeks   ? Drug use: No  ? Sexual activity: Not on file  ?Other Topics Concern  ? Not on file  ?Social History Narrative  ? Not on file  ? ?Social Determinants of Health  ? ?Financial Resource Strain: Not on file  ?Food Insecurity: Not on file  ?Transportation Needs: Not on file  ?Physical Activity: Not on file  ?Stress: Not on file  ?Social Connections: Not on file  ?  ? ?Family History: ?The patient's family history includes Cancer in his mother; Emphysema in his paternal grandmother; Heart attack in his maternal grandfather; Heart disease in his maternal grandfather and paternal grandfather; Hyperlipidemia in his father and sister; Hypertension in his father. ?ROS:   ?Please see the history of present illness.    ?All other systems are reviewed and are negative. ? ?EKGs/Labs/Other Studies Reviewed:   ?EKG:  EKG is ordered today.  As before, it shows marked sinus bradycardia and unchanged left anterior fascicular block. ? ?The following studies were reviewed today: ?Labs 04/14/2020 ? ? ?Hemoglobin 15.3, creatinine 1.35, normal liver function tests, potassium 4.3 ?Total cholesterol 226, HDL 46, LDL 148, triglycerides 182 ?Glucose 95 ? ?Recent Lipid Panel ?   ?Component Value Date/Time  ? CHOL 271 (H) 04/18/2021 1035  ? TRIG 211 (H) 04/18/2021 1035  ? HDL 47 04/18/2021 1035  ? CHOLHDL 5.8 (H) 04/18/2021 1035  ? CHOLHDL 4.0 09/29/2014 0833  ? VLDL 32 (H) 09/29/2014 16100833  ? LDLCALC 184 (H) 04/18/2021 1035  ? ?Physical Exam:   ? ?VS:  BP (!) 170/92 (BP Location: Left Arm, Patient Position: Sitting, Cuff Size: Large)   Pulse (!) 45   Ht 5\' 8"  (1.727 m)   Wt 200 lb 9.6 oz (91 kg)   SpO2 96%   BMI 30.50 kg/m?    ? ?Wt Readings from Last 3 Encounters:  ?04/18/21 200 lb 9.6 oz (91 kg)  ?02/15/21 200 lb (90.7 kg)  ?01/18/21 200 lb (90.7 kg)  ?  ? ?General: Alert, oriented x3, no distress ?Head: no evidence of  trauma, PERRL, EOMI, no exophtalmos or lid lag, no myxedema, no xanthelasma; normal ears, nose and oropharynx ?Neck: normal jugular venous pulsations and no hepatojugular reflux; brisk carotid pulses without delay and no carotid bruits ?Chest: clear to auscultation, no signs of consolidation by percussion or palpation, normal fremitus, symmetrical and full respiratory excursions ?Cardiovascular: normal position and quality of the apical impulse, regular rhythm, normal first and second heart sounds, no murmurs, rubs or gallops ?Abdomen: no tenderness or distention, no masses by palpation, no abnormal pulsatility or arterial bruits, normal bowel sounds, no hepatosplenomegaly ?Extremities: no clubbing, cyanosis or edema; 2+ radial,  ulnar and brachial pulses bilaterally; 2+ right femoral, posterior tibial and dorsalis pedis pulses; 2+ left femoral, posterior tibial and dorsalis pedis pulses; no subclavian or femoral bruits ?Neurological: L Bell's palsy ?Psych: Normal mood and affect ? ? ?ASSESSMENT:   ? ?1. Heterozygous familial hypercholesterolemia   ?2. Overweight (BMI 25.0-29.9)   ?3. Essential hypertension   ?4. Personal history of gout   ?5. Aortic atherosclerosis (HCC)   ?6. Sinus bradycardia   ?7. Other fatigue   ? ? ?PLAN:   ? ?In order of problems listed above: ? ?HLP: He had severely elevated LDL cholesterol consistent with familial heterozygous hypercholesterolemia . Despite rosuvastatin, LDL is still very high. We can add zetia, but suspect he may need PCSK9 inh. No known PAD/CAD. ?Overweight: Has gained a little eight. HDL is worse. Reviewed diet and exercise. He has been slacking on the latter. ?HTN: Higher. May be due to ibuprofen use. Recommend avoiding NSAIDs, prefer tylenol. Send BP log via my chart. Consider dihydropyridine calcium channel blocker (avoid diuretics due to gout and beta blockers due to bradycardia). ?Gout: Avoid thiazide diuretics.  Recent gout attacks. ?Aortic atherosclerosis: Mild,  reported on CT abdomen with no aneurysm seen.  This is an incentive to treat his hypercholesterolemia more aggressively. ?Sinus bradycardia: asymptomatic. Normal HR response to exercise. Check TSH. ? ? ?Medication Ad

## 2021-04-19 ENCOUNTER — Encounter: Payer: Self-pay | Admitting: Cardiovascular Disease

## 2021-04-20 ENCOUNTER — Other Ambulatory Visit: Payer: Self-pay | Admitting: *Deleted

## 2021-04-20 ENCOUNTER — Encounter: Payer: Self-pay | Admitting: Cardiovascular Disease

## 2021-04-20 DIAGNOSIS — E785 Hyperlipidemia, unspecified: Secondary | ICD-10-CM

## 2021-04-20 MED ORDER — EZETIMIBE 10 MG PO TABS: 10.0000 mg | ORAL_TABLET | Freq: Every day | ORAL | 1 refills | Status: DC

## 2021-10-13 ENCOUNTER — Other Ambulatory Visit: Payer: Self-pay | Admitting: Cardiovascular Disease

## 2022-04-16 ENCOUNTER — Other Ambulatory Visit: Payer: Self-pay | Admitting: Cardiovascular Disease

## 2022-06-20 ENCOUNTER — Other Ambulatory Visit: Payer: Self-pay | Admitting: Cardiovascular Disease

## 2022-06-21 ENCOUNTER — Telehealth: Payer: Self-pay | Admitting: Cardiovascular Disease

## 2022-06-21 NOTE — Telephone Encounter (Signed)
Spoke with the pt and a CBC W/DIFF, CMET, LIPID PANEL W/REFLEX, TSH were drawn. Informed him that we should not need any other labs before his appt, but will let him know if we do.  He verbalized undersanding.

## 2022-06-21 NOTE — Telephone Encounter (Signed)
Patient stated he recently had labs done with PCP. Labs are in care everywhere. He would like for you to review them and see if he needs any additional labs prior to OV on 7/17 with Azalee Course.

## 2022-06-21 NOTE — Telephone Encounter (Signed)
Patient wants to know if he will need to have blood drawn prior to visit on 7/17.  Patient noted he had labs done at PCP office last week.

## 2022-06-24 ENCOUNTER — Encounter: Payer: Self-pay | Admitting: Student

## 2022-06-24 ENCOUNTER — Ambulatory Visit: Payer: Medicare Other | Attending: Cardiovascular Disease | Admitting: Student

## 2022-06-24 VITALS — BP 130/78 | HR 50 | Ht 68.0 in | Wt 192.0 lb

## 2022-06-24 DIAGNOSIS — I10 Essential (primary) hypertension: Secondary | ICD-10-CM

## 2022-06-24 DIAGNOSIS — E7801 Familial hypercholesterolemia: Secondary | ICD-10-CM

## 2022-06-24 DIAGNOSIS — R001 Bradycardia, unspecified: Secondary | ICD-10-CM

## 2022-06-24 NOTE — Progress Notes (Signed)
   Cardiology Clinic Note   Date: 06/24/2022 ID: Dean Weaver, Dean Weaver [birth year], MRN [id number]  Primary Cardiologist:  Dean Fair, MD  Patient Profile    Dean Weaver is a 67 y.o. male who presents to the clinic today for routine follow up.    Past medical history significant for: Hypertension.  Hyperlipidemia (likely familial hypercholesterolemia).  Lipid panel 06/14/2022: LDL 85, HDL 48, TG 164, total 161.     History of Present Illness    Dean Weaver was first evaluated by Dr. Royann Weaver on 01/01/2017 for shortness of breath, hypertension, fatigue, bradycardia at the request of Dr. Milus Weaver.  Patient reported exertional lightheadedness trying to do cardio exercise 3-4 times a week.  He has a history of bradycardia with heart rate typically in the 50s.  He is able to increase his heart rate to 130 on the treadmill.  He underwent an exercise stress test which showed hypertensive response to exercise and no ST segment deviation noted during stress.  No evidence of chronotropic incompetence.  Patient was last seen in the office by Dr. Royann Weaver on 04/18/2021 for routine follow-up.  Patient continued to have asymptomatic bradycardia.  LDL remains high and Zetia was added.  His BP was elevated and thought to be secondary to use of ibuprofen.  He was asked to avoid NSAIDs in favor of Tylenol.  Otherwise no medication changes were made.  Today, patient is here alone. He is doing well. Patient denies shortness of breath or dyspnea on exertion. No chest pain, pressure, or tightness. Denies lower extremity edema, orthopnea, or PND. No palpitations. He is chronically bradycardic without lightheadedness, dizziness, presyncope or syncope. He recently retired. He is very active lifting weights at the gym 4-5 times a week, walking his dog, and fishing. He tries to get 10,000 steps in every day.     ROS: All other systems reviewed and are otherwise negative except as noted in History of Present  Illness.  Studies Reviewed    ECG personally reviewed by me today: Sinus bradycardia, LAFB, 50 bpm.  No significant changes from 04/18/2021.      Physical Exam    VS:  BP 130/78 (BP Location: Left Arm, Patient Position: Sitting, Cuff Size: Normal)   Pulse (!) 50   Ht 5\' 8"  (1.727 m)   Wt 192 lb (87.1 kg)   SpO2 97%   BMI 29.19 kg/m  , BMI Body mass index is 29.19 kg/m.  GEN: Well nourished, well developed, in no acute distress. Neck: No JVD or carotid bruits. Cardiac:  RRR. No murmurs. No rubs or gallops.   Respiratory:  Respirations regular and unlabored. Clear to auscultation without rales, wheezing or rhonchi. GI: Soft, nontender, nondistended. Extremities: Radials/DP/PT 2+ and equal bilaterally. No clubbing or cyanosis. No edema.  Skin: Warm and dry, no rash. Neuro: Strength intact.  Assessment & Plan    Hypertension. BP today 130/78. Patient denies headaches, dizziness or vision changes. Continue lisinopril. Hyperlipidemia.  LDL June 2024 85, at goal.  Continue rosuvastatin and Zetia.   Bradycardia.  Patient is chronically bradycardic without lightheadedness, dizziness, presyncope or syncope. Exercise stress test in 2018 showed no evidence of chronotropic incompetence. HR today 50 bpm. Patient understands to contact the office if he becomes symptomatic. Continue to avoid AV nodal blocking agents.   Disposition: Return in 1 year or sooner as needed.          Signed, Etta Grandchild. Elzora Cullins, DNP, NP-C

## 2022-06-24 NOTE — Patient Instructions (Signed)
Medication Instructions:  Your physician recommends that you continue on your current medications as directed. Please refer to the Current Medication list given to you today.  *If you need a refill on your cardiac medications before your next appointment, please call your pharmacy*  Lab Work: NONE ordered at this time of appointment   If you have labs (blood work) drawn today and your tests are completely normal, you will receive your results only by: MyChart Message (if you have MyChart) OR A paper copy in the mail If you have any lab test that is abnormal or we need to change your treatment, we will call you to review the results.  Testing/Procedures: NONE ordered at this time of appointment   Follow-Up: At [facility], you and your health needs are our priority.  As part of our continuing mission to provide you with exceptional heart care, we have created designated Provider Care Teams.  These Care Teams include your primary Cardiologist (physician) and Advanced Practice Providers (APPs -  Physician Assistants and Nurse Practitioners) who all work together to provide you with the care you need, when you need it.  Your next appointment:   1 year(s)  Provider:   Mihai Croitoru, MD     Other Instructions  

## 2022-07-24 ENCOUNTER — Ambulatory Visit: Payer: Medicare Other | Admitting: Physician Assistant

## 2022-08-06 ENCOUNTER — Ambulatory Visit: Payer: Medicare Other | Admitting: Cardiovascular Disease

## 2022-08-07 ENCOUNTER — Other Ambulatory Visit: Payer: Self-pay | Admitting: Cardiovascular Disease

## 2022-08-07 MED ORDER — EZETIMIBE 10 MG PO TABS: 10.0000 mg | ORAL_TABLET | Freq: Every day | ORAL | 0 refills | Status: DC

## 2022-09-10 ENCOUNTER — Other Ambulatory Visit: Payer: Self-pay | Admitting: *Deleted

## 2022-09-10 MED ORDER — EZETIMIBE 10 MG PO TABS: 10.0000 mg | ORAL_TABLET | Freq: Every day | ORAL | 9 refills | Status: DC

## 2022-09-11 ENCOUNTER — Other Ambulatory Visit: Payer: Self-pay | Admitting: Podiatry

## 2022-09-11 MED ORDER — METHYLPREDNISOLONE 4 MG PO TBPK: ORAL_TABLET | ORAL | 0 refills | Status: DC

## 2023-05-19 ENCOUNTER — Other Ambulatory Visit: Payer: Self-pay | Admitting: Podiatry

## 2023-05-19 MED ORDER — PREDNISONE 10 MG PO TABS: ORAL_TABLET | ORAL | 0 refills | Status: DC

## 2023-05-28 ENCOUNTER — Ambulatory Visit: Attending: Cardiovascular Disease | Admitting: Cardiovascular Disease

## 2023-05-28 ENCOUNTER — Encounter: Payer: Self-pay | Admitting: Cardiovascular Disease

## 2023-05-28 VITALS — BP 118/84 | HR 93 | Ht 68.0 in | Wt 197.2 lb

## 2023-05-28 DIAGNOSIS — I10 Essential (primary) hypertension: Secondary | ICD-10-CM | POA: Insufficient documentation

## 2023-05-28 DIAGNOSIS — Z8739 Personal history of other diseases of the musculoskeletal system and connective tissue: Secondary | ICD-10-CM | POA: Insufficient documentation

## 2023-05-28 DIAGNOSIS — E7801 Familial hypercholesterolemia: Secondary | ICD-10-CM | POA: Insufficient documentation

## 2023-05-28 DIAGNOSIS — E663 Overweight: Secondary | ICD-10-CM | POA: Insufficient documentation

## 2023-05-28 DIAGNOSIS — I700 Atherosclerosis of aorta: Secondary | ICD-10-CM | POA: Insufficient documentation

## 2023-05-28 DIAGNOSIS — R001 Bradycardia, unspecified: Secondary | ICD-10-CM | POA: Insufficient documentation

## 2023-05-28 NOTE — Patient Instructions (Signed)
 Medication Instructions:  NO CHANGES *If you need a refill on your cardiac medications before your next appointment, please call your pharmacy*  Lab Work: FASTING LIPID PANEL, CMET, AND URIC ACID TODAY If you have labs (blood work) drawn today and your tests are completely normal, you will receive your results only by: MyChart Message (if you have MyChart) OR A paper copy in the mail If you have any lab test that is abnormal or we need to change your treatment, we will call you to review the results.  Testing/Procedures: Dr. Karyl Paget Croitoru has ordered a CT coronary calcium  score.   Test locations:  [facility]  This is $99 out of pocket.   Coronary CalciumScan A coronary calcium  scan is an imaging test used to look for deposits of calcium  and other fatty materials (plaques) in the inner lining of the blood vessels of the heart (coronary arteries). These deposits of calcium  and plaques can partly clog and narrow the coronary arteries without producing any symptoms or warning signs. This puts a person at risk for a heart attack. This test can detect these deposits before symptoms develop. Tell a health care provider about: Any allergies you have. All medicines you are taking, including vitamins, herbs, eye drops, creams, and over-the-counter medicines. Any problems you or family members have had with anesthetic medicines. Any blood disorders you have. Any surgeries you have had. Any medical conditions you have. Whether you are pregnant or may be pregnant. What are the risks? Generally, this is a safe procedure. However, problems may occur, including: Harm to a pregnant woman and her unborn baby. This test involves the use of radiation. Radiation exposure can be dangerous to a pregnant woman and her unborn baby. If you are pregnant, you generally should not have this procedure done. Slight increase in the risk of cancer. This is because of the radiation involved in the  test. What happens before the procedure? No preparation is needed for this procedure. What happens during the procedure? You will undress and remove any jewelry around your neck or chest. You will put on a hospital gown. Sticky electrodes will be placed on your chest. The electrodes will be connected to an electrocardiogram (ECG) machine to record a tracing of the electrical activity of your heart. A CT scanner will take pictures of your heart. During this time, you will be asked to lie still and hold your breath for 2-3 seconds while a picture of your heart is being taken. The procedure may vary among health care providers and hospitals. What happens after the procedure? You can get dressed. You can return to your normal activities. It is up to you to get the results of your test. Ask your health care provider, or the department that is doing the test, when your results will be ready. Summary A coronary calcium  scan is an imaging test used to look for deposits of calcium  and other fatty materials (plaques) in the inner lining of the blood vessels of the heart (coronary arteries). Generally, this is a safe procedure. Tell your health care provider if you are pregnant or may be pregnant. No preparation is needed for this procedure. A CT scanner will take pictures of your heart. You can return to your normal activities after the scan is done. This information is not intended to replace advice given to you by your health care provider. Make sure you discuss any questions you have with your health care provider. Document Released: 06/22/2007 Document Revised: 11/13/2015  Document Reviewed: 11/13/2015 Elsevier Interactive Patient Education  2017 [employer].   Follow-Up: At [facility], you and your health needs are our priority.  As part of our continuing mission to provide you with exceptional heart care, our providers are all part of one team.  This team includes your primary  Cardiologist (physician) and Advanced Practice Providers or APPs (Physician Assistants and Nurse Practitioners) who all work together to provide you with the care you need, when you need it.  Your next appointment:   1 year(s)  Provider:   Luana Rumple, MD

## 2023-05-28 NOTE — Progress Notes (Signed)
 Cardiology Office Note:    Date:  05/28/2023   ID:  Dean Weaver, DOB 07-Mar-1955, MRN [id number] PCP:  Benedetta Bradley, MD  Cardiologist:  Luana Rumple, MD    Referring MD: No ref. provider found   No chief complaint on file.   History of Present Illness:    Dean Weaver is a 68 y.o. male with a hx of hypercholesterolemia (probably familial), systemic hypertension, gout, left-sided Bell's palsy and chronic sinus bradycardia who is here for routine cardiac follow-up.  He said from a cardiac vascular point of view, but is troubled by multiple musculoskeletal complaints.  The worst are issues with right-sided sciatica, with pain in the classic pattern radiating down the back of his thigh, but also with pain radiating into his right lower quadrant and the lateral surface of his right thigh.  He had some physical therapy and dry needling but this actually made the symptoms worse.  Nevertheless he is still walking his border collie between 2 and 5 miles a day every day at what he reports is a brisk pace.  As always he has bradycardia, heart rate today 45 bpm, but remains completely asymptomatic.  He wears a smart watch that has not reported any heart rate slower than 40 bpm.  He has to frequently take NSAIDs but watches his blood pressure and this has been staying okay.  Today's blood pressure is great.  He has not had recent frank gout attacks.  He does take allopurinol .  The patient specifically denies any chest pain at rest exertion, dyspnea at rest or with exertion, orthopnea, paroxysmal nocturnal dyspnea, syncope, palpitations, focal neurological deficits, intermittent claudication, lower extremity edema, unexplained weight gain, cough, hemoptysis or wheezing.  Previous evaluation for mild bradycardia did not identify evidence of ischemic heart disease or need for pacemaker.  Abdominal CT shows some evidence of aortic atherosclerosis without aneurysm.  His maternal grandfather  died in his 23s from heart disease, but his other relatives have not had premature heart problems.  [name]'s ancestry is Ashkenazi Jewish. He is originally from [place], but has lived in [place] for over 40 years. 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  Past Medical History:  Diagnosis Date   Bell's palsy    left side;    Bradycardia    non symptomatic; has been evaluated by cardio Dr Alvis Ba . see epic encounter 01-01-17   Dyspnea    per cardiologist note in epic  ; patient denies any recently    Gout    Heart murmur    patient self reports that his cardio Dr told hime he had a slight murmur but states " i dont experience anything with it though "   History of appendicitis    reports he was told it ruptured    Hyperlipidemia    Hypertension     Past surgical history: Appendectomy 2019  Current Medications: Current Meds  Medication Sig   allopurinol  (ZYLOPRIM ) 100 MG tablet Take 1 tablet (100 mg total) by mouth daily.   cholecalciferol (VITAMIN D3) 25 MCG (1000 UT) tablet Take 1,000 Units by mouth daily.   ezetimibe  (ZETIA ) 10 MG tablet Take 1 tablet (10 mg total) by mouth daily.   FLUoxetine  (PROZAC ) 20 MG capsule Take 1 capsule (20 mg total) by mouth daily.   lisinopril  (ZESTRIL ) 40 MG tablet TAKE ONE TABLET BY MOUTH DAILY   Multiple Vitamin (MULTIVITAMIN) tablet Take 1 tablet by mouth daily.   naproxen  sodium (ALEVE) 220 MG tablet Take 440 mg by mouth daily as needed.   nitroGLYCERIN  (NITRODUR - DOSED IN MG/24 HR) 0.2 mg/hr patch Use 1/4 patch daily to the affected area.   rosuvastatin  (CRESTOR ) 20 MG tablet Take 1 tablet (20 mg total) by mouth daily.   vitamin E 180 MG (400 UNITS) capsule Take 400 Units by mouth daily.     Allergies:   Patient has no known allergies.   Social History   Socioeconomic History   Marital status: Married    Spouse name: Not on file   Number of children: 2   Years of education: Not on file   Highest education  level: Not on file  Occupational History   Not on file  Tobacco Use   Smoking status: Never   Smokeless tobacco: Former  Substance and Sexual Activity   Alcohol use: Not Currently    Alcohol/week: 3.0 standard drinks of alcohol    Types: 3 Standard drinks or equivalent per week    Comment: no drink in 6 weeks    Drug use: No   Sexual activity: Not on file  Other Topics Concern   Not on file  Social History Narrative   Not on file   Social Drivers of Health   Financial Resource Strain: Not on file  Food Insecurity: Not on file  Transportation Needs: Not on file  Physical Activity: Not on file  Stress: Not on file  Social Connections: Not on file     Family History: The patient's family history includes Cancer in his mother; Emphysema in his paternal grandmother; Heart attack in his maternal grandfather; Heart disease in his maternal grandfather and paternal grandfather; Hyperlipidemia in his father and sister; Hypertension in his father. ROS:   Please see the history of present illness.    All other systems are reviewed and are negative.  EKGs/Labs/Other Studies Reviewed:   EKG:    EKG Interpretation Date/Time:  Wednesday May 28 2023 09:34:19 EDT Ventricular Rate:  45 PR Interval:  154 QRS Duration:  132 QT Interval:  474 QTC Calculation: 410 R Axis:   -61  Text Interpretation: Sinus bradycardia Left axis deviation  Left anterior fasicular block  No significant change since last tracing  Confirmed by Cresencia Asmus [phone]) on 05/28/2023 9:49:08 AM         The following studies were reviewed today: Labs 06/14/2022 Cholesterol 161, HDL 48, LDL 85, triglycerides 164   Recent Lipid Panel    Component Value Date/Time   CHOL 271 (H) 04/18/2021 1035   TRIG 211 (H) 04/18/2021 1035   HDL 47 04/18/2021 1035   CHOLHDL 5.8 (H) 04/18/2021 1035   CHOLHDL 4.0 09/29/2014 0833   VLDL 32 (H) 09/29/2014 0833   LDLCALC 184 (H) 04/18/2021 1035   Physical Exam:     VS:  BP 118/84   Pulse 93   Ht 5\' 8"  (1.727 m)   Wt 89.4 kg   SpO2 96%   BMI 29.98 kg/m     Wt Readings from Last 3 Encounters:  05/28/23 89.4 kg  06/24/22 87.1 kg  04/18/21 91 kg     General: Alert, oriented x3, no distress, borderline obese Head: no evidence of trauma, PERRL, EOMI, no exophtalmos or lid lag, no myxedema, no xanthelasma; normal ears, nose and oropharynx Neck: normal jugular venous pulsations and no hepatojugular reflux; brisk carotid pulses without delay and no carotid bruits Chest: clear to auscultation, no signs of consolidation by percussion or palpation, normal  fremitus, symmetrical and full respiratory excursions Cardiovascular: normal position and quality of the apical impulse, regular rhythm, normal first and second heart sounds, no murmurs, rubs or gallops Abdomen: no tenderness or distention, no masses by palpation, no abnormal pulsatility or arterial bruits, normal bowel sounds, no hepatosplenomegaly Extremities: no clubbing, cyanosis or edema; 2+ radial, ulnar and brachial pulses bilaterally; 2+ right femoral, posterior tibial and dorsalis pedis pulses; 2+ left femoral, posterior tibial and dorsalis pedis pulses; no subclavian or femoral bruits Neurological: grossly nonfocal other than left facial palsy Psych: Normal mood and affect    ASSESSMENT:    1. Heterozygous familial hypercholesterolemia   2. Overweight   3. Essential hypertension   4. History of gout   5. Aortic atherosclerosis (HCC)   6. Sinus bradycardia      PLAN:    In order of problems listed above:  HLP: Recheck labs today.  Check coronary calcium  score.  If this is elevated we should probably add PCSK9 inhibitors to achieve target LDL<100. He had severely elevated LDL cholesterol consistent with familial heterozygous hypercholesterolemia .Aaron Aas No known PAD/CAD. Overweight: Has gained a little more weight. Reviewed diet and exercise.  Harder to exercise due to sciatica, but he  still walks his dog daily. HTN: Well-controlled even though he occasionally has to take NSAIDs. Gout: Avoid thiazide diuretics.  Recent gout attacks.  Would not be a good candidate for Nexletol. Aortic atherosclerosis: Mild.  Normal caliber aorta on CT.  This is an incentive to treat his hypercholesterolemia more aggressively. Sinus bradycardia: Remains completely asymptomatic and had a normal heart response to exercise.  No indication for pacemaker at this time.  TSH was normal.   Medication Adjustments/Labs and Tests Ordered: Current medicines are reviewed at length with the patient today.  Concerns regarding medicines are outlined above.  Orders Placed This Encounter  Procedures   CT CARDIAC SCORING (SELF PAY ONLY)   Lipid panel   Comprehensive metabolic panel with GFR   Uric acid   EKG 12-Lead   No orders of the defined types were placed in this encounter.   Signed, Luana Rumple, MD  05/28/2023 12:23 PM    [facility]

## 2023-05-29 ENCOUNTER — Ambulatory Visit: Payer: Self-pay | Admitting: Cardiovascular Disease

## 2023-05-29 LAB — COMPREHENSIVE METABOLIC PANEL WITH GFR
ALT: 26 IU/L (ref 0–44)
AST: 34 IU/L (ref 0–40)
Albumin: 4.5 g/dL (ref 3.9–4.9)
Alkaline Phosphatase: 71 IU/L (ref 44–121)
BUN/Creatinine Ratio: 21 (ref 10–24)
BUN: 26 mg/dL (ref 8–27)
Bilirubin Total: 0.4 mg/dL (ref 0.0–1.2)
CO2: 22 mmol/L (ref 20–29)
Calcium: 9.5 mg/dL (ref 8.6–10.2)
Chloride: 104 mmol/L (ref 96–106)
Creatinine, Ser: 1.25 mg/dL (ref 0.76–1.27)
Globulin, Total: 1.8 g/dL (ref 1.5–4.5)
Glucose: 92 mg/dL (ref 70–99)
Potassium: 4.9 mmol/L (ref 3.5–5.2)
Sodium: 143 mmol/L (ref 134–144)
Total Protein: 6.3 g/dL (ref 6.0–8.5)
eGFR: 63 mL/min/{1.73_m2} (ref >59)

## 2023-05-29 LAB — LIPID PANEL
Chol/HDL Ratio: 3.1 ratio (ref 0.0–5.0)
Cholesterol, Total: 170 mg/dL (ref 100–199)
HDL: 54 mg/dL (ref >39)
LDL Chol Calc (NIH): 94 mg/dL (ref 0–99)
Triglycerides: 124 mg/dL (ref 0–149)
VLDL Cholesterol Cal: 22 mg/dL (ref 5–40)

## 2023-05-29 LAB — URIC ACID: Uric Acid: 6.5 mg/dL (ref 3.8–8.4)

## 2023-06-10 ENCOUNTER — Ambulatory Visit (HOSPITAL_BASED_OUTPATIENT_CLINIC_OR_DEPARTMENT_OTHER)
Admission: RE | Admit: 2023-06-10 | Discharge: 2023-06-10 | Disposition: A | Discharge status: Home / Self Care | Payer: Self-pay | Source: Ambulatory Visit | Attending: Cardiovascular Disease | Admitting: Cardiovascular Disease

## 2023-06-10 DIAGNOSIS — E7801 Familial hypercholesterolemia: Secondary | ICD-10-CM | POA: Insufficient documentation

## 2023-07-01 ENCOUNTER — Other Ambulatory Visit: Payer: Self-pay | Admitting: Internal Medicine

## 2023-07-01 DIAGNOSIS — M5431 Sciatica, right side: Secondary | ICD-10-CM

## 2023-07-13 ENCOUNTER — Ambulatory Visit
Admission: RE | Admit: 2023-07-13 | Discharge: 2023-07-13 | Disposition: A | Discharge status: Home / Self Care | Source: Ambulatory Visit | Attending: Internal Medicine | Admitting: Internal Medicine

## 2023-07-13 DIAGNOSIS — M5431 Sciatica, right side: Secondary | ICD-10-CM

## 2023-07-15 ENCOUNTER — Other Ambulatory Visit: Payer: Self-pay | Admitting: Cardiovascular Disease

## 2024-06-16 ENCOUNTER — Ambulatory Visit: Admitting: Cardiovascular Disease
